# Patient Record
Sex: Female | Born: 1970 | Race: White | Hispanic: No | Marital: Married | Smoking: Never smoker
Health system: Southern US, Community
[De-identification: ages and names within clinical notes are randomized; demographics above are authoritative.]

## PROBLEM LIST (undated history)

## (undated) DIAGNOSIS — J45909 Unspecified asthma, uncomplicated: Secondary | ICD-10-CM

## (undated) DIAGNOSIS — F329 Major depressive disorder, single episode, unspecified: Secondary | ICD-10-CM

## (undated) DIAGNOSIS — F32A Depression, unspecified: Secondary | ICD-10-CM

## (undated) DIAGNOSIS — B019 Varicella without complication: Secondary | ICD-10-CM

## (undated) DIAGNOSIS — T7840XA Allergy, unspecified, initial encounter: Secondary | ICD-10-CM

## (undated) DIAGNOSIS — F419 Anxiety disorder, unspecified: Secondary | ICD-10-CM

## (undated) HISTORY — DX: Depression, unspecified: F32.A

## (undated) HISTORY — PX: WISDOM TOOTH EXTRACTION: SHX21

## (undated) HISTORY — DX: Unspecified asthma, uncomplicated: J45.909

## (undated) HISTORY — DX: Major depressive disorder, single episode, unspecified: F32.9

## (undated) HISTORY — DX: Anxiety disorder, unspecified: F41.9

## (undated) HISTORY — DX: Varicella without complication: B01.9

## (undated) HISTORY — DX: Allergy, unspecified, initial encounter: T78.40XA

---
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
## 2005-09-02 ENCOUNTER — Ambulatory Visit: Payer: Self-pay | Admitting: Allergy and Immunology

## 2006-08-01 ENCOUNTER — Inpatient Hospital Stay: Payer: Self-pay | Admitting: Obstetrics and Gynecology

## 2016-07-25 ENCOUNTER — Ambulatory Visit: Payer: Self-pay | Admitting: Family Medicine

## 2016-08-20 ENCOUNTER — Ambulatory Visit (INDEPENDENT_AMBULATORY_CARE_PROVIDER_SITE_OTHER): Payer: BC Managed Care – PPO | Admitting: Family Medicine

## 2016-08-20 ENCOUNTER — Encounter: Payer: Self-pay | Admitting: Family Medicine

## 2016-08-20 ENCOUNTER — Encounter (INDEPENDENT_AMBULATORY_CARE_PROVIDER_SITE_OTHER): Payer: Self-pay

## 2016-08-20 VITALS — BP 130/84 | HR 89 | Temp 98.4°F | Resp 18 | Ht 61.5 in | Wt 149.1 lb

## 2016-08-20 DIAGNOSIS — Z111 Encounter for screening for respiratory tuberculosis: Secondary | ICD-10-CM

## 2016-08-20 DIAGNOSIS — Z0000 Encounter for general adult medical examination without abnormal findings: Secondary | ICD-10-CM | POA: Diagnosis not present

## 2016-08-20 DIAGNOSIS — Z23 Encounter for immunization: Secondary | ICD-10-CM | POA: Diagnosis not present

## 2016-08-20 DIAGNOSIS — B019 Varicella without complication: Secondary | ICD-10-CM | POA: Insufficient documentation

## 2016-08-20 MED ORDER — TRIAMCINOLONE ACETONIDE 0.1 % EX OINT: 1.0000 "application " | TOPICAL_OINTMENT | Freq: Two times a day (BID) | CUTANEOUS | 0 refills | Status: DC

## 2016-08-20 NOTE — Patient Instructions (Addendum)
Use the topical as needed.  Follow up annually  Take care  Dr. Adriana Simas  Health Maintenance, Female Adopting a healthy lifestyle and getting preventive care can go a long way to promote health and wellness. Talk with your health care provider about what schedule of regular examinations is right for you. This is a good chance for you to check in with your provider about disease prevention and staying healthy. In between checkups, there are plenty of things you can do on your own. Experts have done a lot of research about which lifestyle changes and preventive measures are most likely to keep you healthy. Ask your health care provider for more information. WEIGHT AND DIET  Eat a healthy diet  Be sure to include plenty of vegetables, fruits, low-fat dairy products, and lean protein.  Do not eat a lot of foods high in solid fats, added sugars, or salt.  Get regular exercise. This is one of the most important things you can do for your health.  Most adults should exercise for at least 150 minutes each week. The exercise should increase your heart rate and make you sweat (moderate-intensity exercise).  Most adults should also do strengthening exercises at least twice a week. This is in addition to the moderate-intensity exercise.  Maintain a healthy weight  Body mass index (BMI) is a measurement that can be used to identify possible weight problems. It estimates body fat based on height and weight. Your health care provider can help determine your BMI and help you achieve or maintain a healthy weight.  For females 63 years of age and older:   A BMI below 18.5 is considered underweight.  A BMI of 18.5 to 24.9 is normal.  A BMI of 25 to 29.9 is considered overweight.  A BMI of 30 and above is considered obese.  Watch levels of cholesterol and blood lipids  You should start having your blood tested for lipids and cholesterol at 43 years of age, then have this test every 5 years.  You  may need to have your cholesterol levels checked more often if:  Your lipid or cholesterol levels are high.  You are older than 43 years of age.  You are at high risk for heart disease.  CANCER SCREENING   Lung Cancer  Lung cancer screening is recommended for adults 36-32 years old who are at high risk for lung cancer because of a history of smoking.  A yearly low-dose CT scan of the lungs is recommended for people who:  Currently smoke.  Have quit within the past 15 years.  Have at least a 30-pack-year history of smoking. A pack year is smoking an average of one pack of cigarettes a day for 1 year.  Yearly screening should continue until it has been 15 years since you quit.  Yearly screening should stop if you develop a health problem that would prevent you from having lung cancer treatment.  Breast Cancer  Practice breast self-awareness. This means understanding how your breasts normally appear and feel.  It also means doing regular breast self-exams. Let your health care provider know about any changes, no matter how small.  If you are in your 20s or 30s, you should have a clinical breast exam (CBE) by a health care provider every 1-3 years as part of a regular health exam.  If you are 79 or older, have a CBE every year. Also consider having a breast X-ray (mammogram) every year.  If you have a family  history of breast cancer, talk to your health care provider about genetic screening.  If you are at high risk for breast cancer, talk to your health care provider about having an MRI and a mammogram every year.  Breast cancer gene (BRCA) assessment is recommended for women who have family members with BRCA-related cancers. BRCA-related cancers include:  Breast.  Ovarian.  Tubal.  Peritoneal cancers.  Results of the assessment will determine the need for genetic counseling and BRCA1 and BRCA2 testing. Cervical Cancer Your health care provider may recommend that you  be screened regularly for cancer of the pelvic organs (ovaries, uterus, and vagina). This screening involves a pelvic examination, including checking for microscopic changes to the surface of your cervix (Pap test). You may be encouraged to have this screening done every 3 years, beginning at age 61.  For women ages 47-65, health care providers may recommend pelvic exams and Pap testing every 3 years, or they may recommend the Pap and pelvic exam, combined with testing for human papilloma virus (HPV), every 5 years. Some types of HPV increase your risk of cervical cancer. Testing for HPV may also be done on women of any age with unclear Pap test results.  Other health care providers may not recommend any screening for nonpregnant women who are considered low risk for pelvic cancer and who do not have symptoms. Ask your health care provider if a screening pelvic exam is right for you.  If you have had past treatment for cervical cancer or a condition that could lead to cancer, you need Pap tests and screening for cancer for at least 20 years after your treatment. If Pap tests have been discontinued, your risk factors (such as having a new sexual partner) need to be reassessed to determine if screening should resume. Some women have medical problems that increase the chance of getting cervical cancer. In these cases, your health care provider may recommend more frequent screening and Pap tests. Colorectal Cancer  This type of cancer can be detected and often prevented.  Routine colorectal cancer screening usually begins at 43 years of age and continues through 43 years of age.  Your health care provider may recommend screening at an earlier age if you have risk factors for colon cancer.  Your health care provider may also recommend using home test kits to check for hidden blood in the stool.  A small camera at the end of a tube can be used to examine your colon directly (sigmoidoscopy or colonoscopy).  This is done to check for the earliest forms of colorectal cancer.  Routine screening usually begins at age 30.  Direct examination of the colon should be repeated every 5-10 years through 43 years of age. However, you may need to be screened more often if early forms of precancerous polyps or small growths are found. Skin Cancer  Check your skin from head to toe regularly.  Tell your health care provider about any new moles or changes in moles, especially if there is a change in a mole's shape or color.  Also tell your health care provider if you have a mole that is larger than the size of a pencil eraser.  Always use sunscreen. Apply sunscreen liberally and repeatedly throughout the day.  Protect yourself by wearing long sleeves, pants, a wide-brimmed hat, and sunglasses whenever you are outside. HEART DISEASE, DIABETES, AND HIGH BLOOD PRESSURE   High blood pressure causes heart disease and increases the risk of stroke. High blood pressure  is more likely to develop in:  People who have blood pressure in the high end of the normal range (130-139/85-89 mm Hg).  People who are overweight or obese.  People who are African American.  If you are 105-22 years of age, have your blood pressure checked every 3-5 years. If you are 70 years of age or older, have your blood pressure checked every year. You should have your blood pressure measured twice--once when you are at a hospital or clinic, and once when you are not at a hospital or clinic. Record the average of the two measurements. To check your blood pressure when you are not at a hospital or clinic, you can use:  An automated blood pressure machine at a pharmacy.  A home blood pressure monitor.  If you are between 41 years and 15 years old, ask your health care provider if you should take aspirin to prevent strokes.  Have regular diabetes screenings. This involves taking a blood sample to check your fasting blood sugar level.  If you  are at a normal weight and have a low risk for diabetes, have this test once every three years after 43 years of age.  If you are overweight and have a high risk for diabetes, consider being tested at a younger age or more often. PREVENTING INFECTION  Hepatitis B  If you have a higher risk for hepatitis B, you should be screened for this virus. You are considered at high risk for hepatitis B if:  You were born in a country where hepatitis B is common. Ask your health care provider which countries are considered high risk.  Your parents were born in a high-risk country, and you have not been immunized against hepatitis B (hepatitis B vaccine).  You have HIV or AIDS.  You use needles to inject street drugs.  You live with someone who has hepatitis B.  You have had sex with someone who has hepatitis B.  You get hemodialysis treatment.  You take certain medicines for conditions, including cancer, organ transplantation, and autoimmune conditions. Hepatitis C  Blood testing is recommended for:  Everyone born from [birth year] through 1963.  Anyone with known risk factors for hepatitis C. Sexually transmitted infections (STIs)  You should be screened for sexually transmitted infections (STIs) including gonorrhea and chlamydia if:  You are sexually active and are younger than 43 years of age.  You are older than 43 years of age and your health care provider tells you that you are at risk for this type of infection.  Your sexual activity has changed since you were last screened and you are at an increased risk for chlamydia or gonorrhea. Ask your health care provider if you are at risk.  If you do not have HIV, but are at risk, it may be recommended that you take a prescription medicine daily to prevent HIV infection. This is called pre-exposure prophylaxis (PrEP). You are considered at risk if:  You are sexually active and do not regularly use condoms or know the HIV status of your  partner(s).  You take drugs by injection.  You are sexually active with a partner who has HIV. Talk with your health care provider about whether you are at high risk of being infected with HIV. If you choose to begin PrEP, you should first be tested for HIV. You should then be tested every 3 months for as long as you are taking PrEP.  PREGNANCY   If you are premenopausal and  you may become pregnant, ask your health care provider about preconception counseling.  If you may become pregnant, take 400 to 800 micrograms (mcg) of folic acid every day.  If you want to prevent pregnancy, talk to your health care provider about birth control (contraception). OSTEOPOROSIS AND MENOPAUSE   Osteoporosis is a disease in which the bones lose minerals and strength with aging. This can result in serious bone fractures. Your risk for osteoporosis can be identified using a bone density scan.  If you are 53 years of age or older, or if you are at risk for osteoporosis and fractures, ask your health care provider if you should be screened.  Ask your health care provider whether you should take a calcium or vitamin D supplement to lower your risk for osteoporosis.  Menopause may have certain physical symptoms and risks.  Hormone replacement therapy may reduce some of these symptoms and risks. Talk to your health care provider about whether hormone replacement therapy is right for you.  HOME CARE INSTRUCTIONS   Schedule regular health, dental, and eye exams.  Stay current with your immunizations.   Do not use any tobacco products including cigarettes, chewing tobacco, or electronic cigarettes.  If you are pregnant, do not drink alcohol.  If you are breastfeeding, limit how much and how often you drink alcohol.  Limit alcohol intake to no more than 1 drink per day for nonpregnant women. One drink equals 12 ounces of beer, 5 ounces of wine, or 1 ounces of hard liquor.  Do not use street drugs.  Do  not share needles.  Ask your health care provider for help if you need support or information about quitting drugs.  Tell your health care provider if you often feel depressed.  Tell your health care provider if you have ever been abused or do not feel safe at home.   This information is not intended to replace advice given to you by your health care provider. Make sure you discuss any questions you have with your health care provider.   Document Released: 04/22/2011 Document Revised: 10/28/2014 Document Reviewed: 09/08/2013 Elsevier Interactive Patient Education [employer].

## 2016-08-20 NOTE — Assessment & Plan Note (Signed)
Pap smear and mammogram up to date. Flu vaccine today.  TB skin test for work. PRN Triamcinolone for eczema. Screening labs today.

## 2016-08-20 NOTE — Progress Notes (Signed)
Subjective:  Patient ID: Sarah Mahoney, female    DOB: 1970-11-30  Age: 45 y.o. MRN: [id number]  CC: Annual exam, Eczema  HPI Glorimar Stroope is a 45 y.o. female presents to the clinic today to establish care and for an annual exam.  Preventative Healthcare  Pap smear: Up to date. 12/16.  Mammogram: Up to date. 7/17  Immunizations  Tetanus - Up to date.   Flu - In need of tody.  Labs: Screening labs today.   Exercise: No regular exercise.   Alcohol use: Yes.   Smoking/tobacco use: No.   PMH, Surgical Hx, Family Hx, Social History reviewed and updated as below.  Past Medical History:  Diagnosis Date  . Allergy   . Anxiety and depression   . Asthma   . Chicken pox    Past Surgical History:  Procedure Laterality Date  . TONSILLECTOMY     Family History  Problem Relation Age of Onset  . Arthritis Mother   . Arthritis Father   . Diabetes Father   . Heart disease Maternal Aunt   . Sudden Cardiac Death Maternal Aunt   . Arthritis Maternal Grandmother   . Hypertension Maternal Grandmother   . Anxiety disorder Maternal Grandmother   . Arthritis Maternal Grandfather   . Arthritis Paternal Grandmother   . Depression Paternal Grandmother   . Arthritis Paternal Grandfather   . Heart disease Paternal Grandfather   . Sudden Cardiac Death Paternal Grandfather    Social History  Substance Use Topics  . Smoking status: Never Smoker  . Smokeless tobacco: Never Used  . Alcohol use Yes    Review of Systems  Respiratory: Positive for cough.   Genitourinary:       Urinary incontinence.   Skin:       Eczema.   Psychiatric/Behavioral:       Sadness, anxiety, stress.  All other systems reviewed and are negative.   Objective:   Today's Vitals: BP 130/84 (BP Location: Right Arm, Patient Position: Sitting, Cuff Size: Normal)   Pulse 89   Temp 98.4 F (36.9 C) (Oral)   Resp 18   Ht 5' 1.5" (1.562 m)   Wt 149 lb 2 oz (67.6 kg)   SpO2 98%   BMI 27.72 kg/m    Physical Exam  Constitutional: She is oriented to person, place, and time. She appears well-developed and well-nourished. No distress.  HENT:  Head: Normocephalic and atraumatic.  Nose: Nose normal.  Mouth/Throat: Oropharynx is clear and moist. No oropharyngeal exudate.  Normal TM's bilaterally.   Eyes: Conjunctivae are normal. No scleral icterus.  Neck: Neck supple. No thyromegaly present.  Cardiovascular: Normal rate and regular rhythm.   No murmur heard. Pulmonary/Chest: Effort normal and breath sounds normal. She has no wheezes. She has no rales.  Abdominal: Soft. She exhibits no distension. There is no tenderness. There is no rebound and no guarding.  Musculoskeletal: Normal range of motion. She exhibits no edema.  Lymphadenopathy:    She has no cervical adenopathy.  Neurological: She is alert and oriented to person, place, and time.  Skin:  Scattered patches of eczema.    Psychiatric: She has a normal mood and affect.  Vitals reviewed.  Assessment & Plan:   Problem List Items Addressed This Visit    Annual physical exam - Primary    Pap smear and mammogram up to date. Flu vaccine today.  TB skin test for work. PRN Triamcinolone for eczema. Screening labs today.  Relevant Orders   CBC   Comp Met (CMET)   HgB A1c   Lipid Profile    Other Visit Diagnoses    Encounter for immunization       Relevant Orders   Flu Vaccine QUAD 36+ mos IM (Completed)   PPD (Completed)   Encounter for PPD test       Relevant Orders   PPD (Completed)      Outpatient Encounter Prescriptions as of 08/20/2016  Medication Sig  . triamcinolone ointment (KENALOG) 0.1 % Apply 1 application topically 2 (two) times daily.   No facility-administered encounter medications on file as of 08/20/2016.     Follow-up: PRN  [place]

## 2016-08-21 LAB — COMPREHENSIVE METABOLIC PANEL
ALBUMIN: 4.1 g/dL (ref 3.5–5.2)
ALT: 15 U/L (ref 0–35)
AST: 17 U/L (ref 0–37)
Alkaline Phosphatase: 41 U/L (ref 39–117)
BILIRUBIN TOTAL: 0.3 mg/dL (ref 0.2–1.2)
BUN: 17 mg/dL (ref 6–23)
CALCIUM: 9.2 mg/dL (ref 8.4–10.5)
CO2: 28 mEq/L (ref 19–32)
CREATININE: 0.91 mg/dL (ref 0.40–1.20)
Chloride: 105 mEq/L (ref 96–112)
GFR: 71.62 mL/min (ref >60.00)
Glucose, Bld: 80 mg/dL (ref 70–99)
Potassium: 4 mEq/L (ref 3.5–5.1)
Sodium: 139 mEq/L (ref 135–145)
Total Protein: 6.3 g/dL (ref 6.0–8.3)

## 2016-08-21 LAB — CBC
HCT: 41.1 % (ref 36.0–46.0)
Hemoglobin: 13.7 g/dL (ref 12.0–15.0)
MCHC: 33.3 g/dL (ref 30.0–36.0)
MCV: 91.4 fl (ref 78.0–100.0)
PLATELETS: 279 10*3/uL (ref 150.0–400.0)
RBC: 4.5 Mil/uL (ref 3.87–5.11)
RDW: 13.1 % (ref 11.5–15.5)
WBC: 8.2 10*3/uL (ref 4.0–10.5)

## 2016-08-21 LAB — LIPID PANEL
CHOLESTEROL: 186 mg/dL (ref 0–200)
HDL: 54.1 mg/dL (ref >39.00)
LDL CALC: 99 mg/dL (ref 0–99)
NONHDL: 131.42
Total CHOL/HDL Ratio: 3
Triglycerides: 162 mg/dL — ABNORMAL HIGH (ref 0.0–149.0)
VLDL: 32.4 mg/dL (ref 0.0–40.0)

## 2016-08-21 LAB — HEMOGLOBIN A1C: HEMOGLOBIN A1C: 5.5 % (ref 4.6–6.5)

## 2016-08-22 ENCOUNTER — Ambulatory Visit: Payer: BC Managed Care – PPO

## 2016-08-22 ENCOUNTER — Ambulatory Visit (INDEPENDENT_AMBULATORY_CARE_PROVIDER_SITE_OTHER): Payer: BC Managed Care – PPO

## 2016-08-22 DIAGNOSIS — Z111 Encounter for screening for respiratory tuberculosis: Secondary | ICD-10-CM

## 2016-08-22 LAB — TB SKIN TEST
INDURATION: 0 mm
TB SKIN TEST: NEGATIVE

## 2016-08-22 NOTE — Progress Notes (Signed)
Patient came in for PPD reading .  She stated she left some forms with you when she was in for office visit on 08/20/2016.  She would like to come by to pick those forms up

## 2016-09-25 ENCOUNTER — Telehealth: Payer: Self-pay | Admitting: *Deleted

## 2016-09-25 NOTE — Telephone Encounter (Signed)
Pt requested a copy of her PPD reading Pt contact [phone]

## 2016-09-26 NOTE — Telephone Encounter (Signed)
Left voicemail advising copy of immunization record printed

## 2017-01-08 NOTE — Telephone Encounter (Signed)
Pt did not pick up immunization I have sent to shred.

## 2017-06-11 ENCOUNTER — Ambulatory Visit (INDEPENDENT_AMBULATORY_CARE_PROVIDER_SITE_OTHER): Payer: BLUE CROSS/BLUE SHIELD | Admitting: Certified Nurse Midwife

## 2017-06-11 ENCOUNTER — Encounter: Payer: Self-pay | Admitting: Certified Nurse Midwife

## 2017-06-11 VITALS — BP 104/64 | HR 70 | Ht 62.0 in | Wt 157.0 lb

## 2017-06-11 DIAGNOSIS — F32A Depression, unspecified: Secondary | ICD-10-CM | POA: Insufficient documentation

## 2017-06-11 DIAGNOSIS — Z1231 Encounter for screening mammogram for malignant neoplasm of breast: Secondary | ICD-10-CM | POA: Diagnosis not present

## 2017-06-11 DIAGNOSIS — F419 Anxiety disorder, unspecified: Secondary | ICD-10-CM | POA: Insufficient documentation

## 2017-06-11 DIAGNOSIS — Z01419 Encounter for gynecological examination (general) (routine) without abnormal findings: Secondary | ICD-10-CM | POA: Diagnosis not present

## 2017-06-11 DIAGNOSIS — J45909 Unspecified asthma, uncomplicated: Secondary | ICD-10-CM | POA: Insufficient documentation

## 2017-06-11 DIAGNOSIS — Z124 Encounter for screening for malignant neoplasm of cervix: Secondary | ICD-10-CM | POA: Diagnosis not present

## 2017-06-11 DIAGNOSIS — N939 Abnormal uterine and vaginal bleeding, unspecified: Secondary | ICD-10-CM

## 2017-06-11 DIAGNOSIS — Z113 Encounter for screening for infections with a predominantly sexual mode of transmission: Secondary | ICD-10-CM

## 2017-06-11 DIAGNOSIS — F329 Major depressive disorder, single episode, unspecified: Secondary | ICD-10-CM | POA: Insufficient documentation

## 2017-06-11 DIAGNOSIS — Z1239 Encounter for other screening for malignant neoplasm of breast: Secondary | ICD-10-CM

## 2017-06-11 MED ORDER — DOXYCYCLINE HYCLATE 100 MG PO CAPS: 100.0000 mg | ORAL_CAPSULE | Freq: Two times a day (BID) | ORAL | 0 refills | Status: AC

## 2017-06-11 NOTE — Progress Notes (Signed)
Gynecology Annual Exam  PCP: Tommie Sams, DO  Chief Complaint:  Chief Complaint  Patient presents with  . Gynecologic Exam    History of Present Illness:Sarah Mahoney presents today for her annual exam. She is a 46 year old Caucasian/White female, G1 P1001, whose LMP was 05/20/2017. Her menses are irregular with her Mirena IUD. They occur every 3-5 weeks and last 7-14 days, but are very light. She states that her last few menses have lasted 14 days.  She has occasional dysmenorrhea, but does not need analgesia . She denies any new sexual partners. She had her IUD replaced 08/15/2016  The patient's past medical history is notable for a history of Allergic rhinitis, Anxiety, and Major Depression (Recurrent). Since her last annual GYN exam dated 10/04/2015, she has had no significant changes in her health history. Has noticed alternating diarrhea and constipation. 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 She is sexually active. She is currently using an IUD for contraception. The Mirena IUD was placed on 08/15/2016.  Her most recent pap smear was obtained 10/04/2015 and was NIL/negative HRHPV Her most recent mammogram obtained on 06/15/2015 was normal. There is no family history of breast cancer. There is no family history of ovarian cancer. The patient does do monthly self breast exams.  The patient does not smoke.  The patient does drink rarely.  The patient does not use illegal drugs. She does exercise regularly.  The patient does get adequate calcium in her diet.  She had a recent cholesterol screen 08/2016 that was normal.      Review of Systems: Review of Systems  Constitutional: Negative for chills, fever and weight loss.       Positive for weight gain  HENT: Negative for congestion, sinus pain and sore throat.   Eyes: Negative for blurred vision and pain.    Respiratory: Negative for hemoptysis, shortness of breath and wheezing.   Cardiovascular: Negative for chest pain, palpitations and leg swelling.  Gastrointestinal: Positive for constipation and diarrhea. Negative for abdominal pain, blood in stool, heartburn, nausea and vomiting.       Alternating diarrhea and constipation  Genitourinary: Negative for dysuria, frequency, hematuria and urgency.  Musculoskeletal: Negative for back pain, joint pain and myalgias.  Skin: Negative for itching and rash.  Neurological: Negative for dizziness, tingling and headaches.  Endo/Heme/Allergies: Negative for environmental allergies and polydipsia. Does not bruise/bleed easily.       Negative for hirsutism   Psychiatric/Behavioral: Negative for depression. The patient is nervous/anxious. The patient does not have insomnia.     Past Medical History:  Past Medical History:  Diagnosis Date  . Allergy   . Anxiety   . Asthma   . Chicken pox   . Depression     Past Surgical History:  Past Surgical History:  Procedure Laterality Date  . ADENOIDECTOMY  1987  . TONSILLECTOMY  1987  . WISDOM TOOTH EXTRACTION      Family History:  Family History  Problem Relation Age of Onset  . Arthritis Mother   . Arthritis Father   . Diabetes Father   . Heart disease Maternal Aunt   . Sudden Cardiac Death Maternal Aunt   . Arthritis Maternal Grandmother   . Hypertension Maternal Grandmother   . Anxiety disorder Maternal Grandmother   . Arthritis Maternal Grandfather   . Arthritis Paternal Grandmother   .  Depression Paternal Grandmother   . Arthritis Paternal Grandfather   . Heart disease Paternal Grandfather   . Sudden Cardiac Death Paternal Grandfather     Social History:  Social History   Social History  . Marital status: Legally Separated    Spouse name: N/A  . Number of children: 1  . Years of education: N/A   Occupational History  . Not on file.   Social History Main Topics  . Smoking  status: Never Smoker  . Smokeless tobacco: Never Used  . Alcohol use Yes     Comment: rare  . Drug use: No  . Sexual activity: Yes    Partners: Male    Birth control/ protection: IUD   Other Topics Concern  . Not on file   Social History Narrative  . No narrative on file    Allergies:  No Known Allergies  Medications: Current Outpatient Prescriptions:  .  ALPRAZolam (XANAX) 0.5 MG tablet, Take by mouth., Disp: , Rfl:  .  busPIRone (BUSPAR) 15 MG tablet, Take by mouth., Disp: , Rfl:  .  fluticasone (FLONASE) 50 MCG/ACT nasal spray, , Disp: , Rfl: 4 .  guaiFENesin (MUCINEX) 600 MG 12 hr tablet, Take by mouth., Disp: , Rfl:  .  levonorgestrel (MIRENA) 20 MCG/24HR IUD, 1 each by Intrauterine route once., Disp: , Rfl:  .  mirtazapine (REMERON) 30 MG tablet, Take by mouth., Disp: , Rfl:  .  triamcinolone ointment (KENALOG) 0.1 %, Apply 1 application topically 2 (two) times daily., Disp: 30 g, Rfl: 0 .  doxycycline (VIBRAMYCIN) 100 MG capsule, Take 1 capsule (100 mg total) by mouth 2 (two) times daily., Disp: 20 capsule, Rfl: 0 Physical Exam Vitals:BP 104/64   Pulse 70   Ht 5\' 2"  (1.575 m)   Wt 157 lb (71.2 kg)   LMP 05/20/2017 (Exact Date)   BMI 28.72 kg/m   General: WF in  NAD HEENT: normocephalic, anicteric Neck: no thyroid enlargement, no palpable nodules, no cervical lymphadenopathy  Pulmonary: No increased work of breathing, CTAB Cardiovascular: RRR, without murmur  Breast: Breast symmetrical, no tenderness, no palpable nodules or masses, no skin or nipple retraction present, no nipple discharge.  No axillary, infraclavicular or supraclavicular lymphadenopathy. Abdomen: Soft, non-tender, non-distended.  Umbilicus without lesions.  No hepatomegaly or masses palpable. No evidence of hernia. Genitourinary:  External: Normal external female genitalia.  Normal urethral meatus, normal Bartholin's and Skene's glands.    Vagina: Normal vaginal mucosa, no evidence of prolapse.      Cervix: Grossly normal in appearance, no bleeding, non-tender, IUD strings visible  Uterus: Anteverted, normal size, shape, and consistency, mobile, and non-tender  Adnexa: No adnexal masses, non-tender  Rectal: deferred  Lymphatic: no evidence of inguinal lymphadenopathy Extremities: no edema, erythema, or tenderness Neurologic: Grossly intact Psychiatric: mood appropriate, affect full     Assessment: 46 y.o. G1P1001 annual gyn exam Prolonged bleeding on the Mirena IUD  Plan:   1) Breast cancer screening - recommend monthly self breast exam and annual mammograms.. Mammogram was ordered today. Patient to schedule at [facility]  2) Options for treatment of bothersome bleeding with the Mirena: 1) cycling on BCPs, 2) treatment with NSAIDs, 3) removal of IUD and further evaluation if bleeding problems continue, 4)course of doxycycline. Patient desires to try a course of doxycycline.Discussed possible side effects of antibiotics. Instructed to avoid prolonged sun exposure and to take meds with food. To RTO if bothersome bleeding persists  3) Cervical cancer screening - Pap was done  4) Routine healthcare maintenance including cholesterol and diabetes screening managed by PCP   Farrel Conners, CNM

## 2017-06-13 LAB — PAP IG, CT-NG, RFX HPV ALL
CHLAMYDIA, NUC. ACID AMP: NEGATIVE
GONOCOCCUS BY NUCLEIC ACID AMP: NEGATIVE
PAP SMEAR COMMENT: 0

## 2017-07-21 ENCOUNTER — Telehealth: Payer: Self-pay

## 2017-07-21 NOTE — Telephone Encounter (Signed)
Pt calling to ck on test results.  [phone].  Pt aware of neg pap results.

## 2017-10-07 ENCOUNTER — Ambulatory Visit
Admission: RE | Admit: 2017-10-07 | Discharge: 2017-10-07 | Disposition: A | Discharge status: Home / Self Care | Payer: BLUE CROSS/BLUE SHIELD | Source: Ambulatory Visit | Attending: Certified Nurse Midwife | Admitting: Certified Nurse Midwife

## 2017-10-07 DIAGNOSIS — Z1239 Encounter for other screening for malignant neoplasm of breast: Secondary | ICD-10-CM

## 2017-10-07 DIAGNOSIS — Z1231 Encounter for screening mammogram for malignant neoplasm of breast: Secondary | ICD-10-CM | POA: Insufficient documentation

## 2017-10-10 ENCOUNTER — Inpatient Hospital Stay
Admission: RE | Admit: 2017-10-10 | Discharge: 2017-10-10 | Disposition: A | Discharge status: Home / Self Care | Payer: Self-pay | Source: Ambulatory Visit | Attending: *Deleted | Admitting: *Deleted

## 2017-10-10 ENCOUNTER — Other Ambulatory Visit: Payer: Self-pay | Admitting: *Deleted

## 2017-10-10 DIAGNOSIS — Z9289 Personal history of other medical treatment: Secondary | ICD-10-CM

## 2017-10-27 ENCOUNTER — Encounter: Payer: Self-pay | Admitting: Certified Nurse Midwife

## 2017-10-27 ENCOUNTER — Ambulatory Visit: Payer: BLUE CROSS/BLUE SHIELD | Admitting: Certified Nurse Midwife

## 2017-10-27 VITALS — BP 120/80 | HR 85 | Ht 62.0 in | Wt 163.0 lb

## 2017-10-27 DIAGNOSIS — N921 Excessive and frequent menstruation with irregular cycle: Secondary | ICD-10-CM | POA: Diagnosis not present

## 2017-10-27 NOTE — Progress Notes (Signed)
Obstetrics & Gynecology Office Visit   Chief Complaint:  Chief Complaint  Patient presents with  . Menstrual Problem    History of Present Illness: Sarah Mahoney is a 47 year old Caucasian/White female, G1 P1001, whose LMP was 09/1717. Her menses are irregular with her Mirena IUD. They occur every 3-5 weeks and last 7-14 days, but are usually very light. When she was seen for her annual in August she complained of her menses lasting 14 days.  She opted to try a course of doxycycline to help stabilize her endometrium and her next two or three menses did shorten a little. In November, she was at work and her menses started with a large gush saturating her clothes and her menses went on to last 2 weeks. Her bleeding with that menses was heavier than usual (moderate flow). There was a week break from bleeding then it restarted and lasted another week. She has occasional dysmenorrhea, but does not need analgesia . She denies any new sexual partners. She had her IUD replaced 08/15/2016.   Review of Systems:  ROS -see HPI  Past Medical History:  Past Medical History:  Diagnosis Date  . Allergy   . Anxiety   . Asthma   . Chicken pox   . Depression     Past Surgical History:  Past Surgical History:  Procedure Laterality Date  . ADENOIDECTOMY  1987  . TONSILLECTOMY  1987  . WISDOM TOOTH EXTRACTION      Gynecologic History: Patient's last menstrual period was 10/06/2017.  Obstetric History: G1P1001  Family History:  Family History  Problem Relation Age of Onset  . Arthritis Mother   . Arthritis Father   . Diabetes Father   . Heart disease Maternal Aunt   . Sudden Cardiac Death Maternal Aunt   . Arthritis Maternal Grandmother   . Hypertension Maternal Grandmother   . Anxiety disorder Maternal Grandmother   . Arthritis Maternal Grandfather   . Arthritis Paternal Grandmother   . Depression Paternal Grandmother   . Arthritis Paternal Grandfather   . Heart disease Paternal  Grandfather   . Sudden Cardiac Death Paternal Grandfather     Social History:  Social History   Socioeconomic History  . Marital status: Legally Separated    Spouse name: Not on file  . Number of children: 1  . Years of education: Not on file  . Highest education level: Not on file  Social Needs  . Financial resource strain: Not on file  . Food insecurity - worry: Not on file  . Food insecurity - inability: Not on file  . Transportation needs - medical: Not on file  . Transportation needs - non-medical: Not on file  Occupational History  . Not on file  Tobacco Use  . Smoking status: Never Smoker  . Smokeless tobacco: Never Used  Substance and Sexual Activity  . Alcohol use: Yes    Comment: rare  . Drug use: No  . Sexual activity: Yes    Partners: Male    Birth control/protection: IUD  Other Topics Concern  . Not on file  Social History Narrative  . Not on file    Allergies:  No Known Allergies  Medications: Prior to Admission medications   Medication Sig Start Date End Date Taking? Authorizing Provider  ALPRAZolam Prudy Feeler(XANAX) 0.5 MG tablet Take by mouth.    [provider]  busPIRone (BUSPAR) 15 MG tablet Take by mouth.    [provider]  fluticasone Aleda Grana(FLONASE)  50 MCG/ACT nasal spray  06/02/17   [provider]  guaiFENesin (MUCINEX) 600 MG 12 hr tablet Take by mouth.    [provider]  levonorgestrel (MIRENA) 20 MCG/24HR IUD 1 each by Intrauterine route once.    [provider]  mirtazapine (REMERON) 30 MG tablet Take by mouth.    [provider]  triamcinolone ointment (KENALOG) 0.1 % Apply 1 application topically 2 (two) times daily. 08/20/16   Tommie Sams, DO    Physical Exam Vitals:BP 120/80   Pulse 85   Ht 5\' 2"  (1.575 m)   Wt 163 lb (73.9 kg)   LMP 10/06/2017   BMI 29.81 kg/m  Patient's last menstrual period was 10/06/2017.  Physical Exam  Constitutional: She is oriented to person, place, and  time. She appears well-developed and well-nourished. No distress.  GI: Soft. She exhibits no distension. There is no tenderness.  Genitourinary:  Genitourinary Comments: Vulva: no lesions or blood Vagina: scant bloody discharge Cervix: IUD strings at the cervical os, two small [place] cysts at 1 and 5 o'clock near os, NT Uterus: RF, NSSC, mobile, NT  Musculoskeletal: Normal range of motion.  Neurological: She is alert and oriented to person, place, and time.  Skin: Skin is warm and dry.  Psychiatric: She has a normal mood and affect.     Assessment: 47 y.o. G1P1001 metrorrhagia/ prolonged bleeding on IUD  Plan: Discussed options with patient: 1) Remove IUD and see if bleeding normalizes. Use condoms for [facility].2) Remove IUD and do a SIS to look for polyps/fibroids 3) Keep IUD and start OCPS to try to control bleeding. 4) Remove IUD and start OCPS. Patient desires plan#2. Scheduled to have IUD removed and SIS with Dr Jean Rosenthal on 21 January (pt preference) Declined removal of IUD today.  Farrel Conners, CNM

## 2017-11-10 ENCOUNTER — Other Ambulatory Visit: Payer: Self-pay | Admitting: Obstetrics and Gynecology

## 2017-11-10 ENCOUNTER — Encounter: Payer: Self-pay | Admitting: Certified Nurse Midwife

## 2017-11-10 ENCOUNTER — Ambulatory Visit (INDEPENDENT_AMBULATORY_CARE_PROVIDER_SITE_OTHER): Payer: BLUE CROSS/BLUE SHIELD | Admitting: Obstetrics and Gynecology

## 2017-11-10 ENCOUNTER — Encounter: Payer: Self-pay | Admitting: Obstetrics and Gynecology

## 2017-11-10 ENCOUNTER — Ambulatory Visit (INDEPENDENT_AMBULATORY_CARE_PROVIDER_SITE_OTHER): Payer: BLUE CROSS/BLUE SHIELD | Admitting: Certified Nurse Midwife

## 2017-11-10 ENCOUNTER — Ambulatory Visit (INDEPENDENT_AMBULATORY_CARE_PROVIDER_SITE_OTHER): Payer: BLUE CROSS/BLUE SHIELD

## 2017-11-10 VITALS — BP 130/80 | HR 83 | Ht 62.0 in | Wt 161.0 lb

## 2017-11-10 DIAGNOSIS — N921 Excessive and frequent menstruation with irregular cycle: Secondary | ICD-10-CM

## 2017-11-10 DIAGNOSIS — Z30432 Encounter for removal of intrauterine contraceptive device: Secondary | ICD-10-CM

## 2017-11-10 DIAGNOSIS — N939 Abnormal uterine and vaginal bleeding, unspecified: Secondary | ICD-10-CM

## 2017-11-10 MED ORDER — NORETHIN-ETH ESTRAD-FE BIPHAS 1 MG-10 MCG / 10 MCG PO TABS: 1.0000 | ORAL_TABLET | Freq: Every day | ORAL | 3 refills | Status: DC

## 2017-11-10 NOTE — Progress Notes (Signed)
Gynecology Ultrasound Follow Up  Chief Complaint: menorrhagia with irregular cycle.   History of Present Illness: Patient is a 47 y.o. female who presents today for ultrasound evaluation of the above .  Ultrasound demonstrates the following findings Adnexa: no masses seen  Uterus: anteverted with endometrial stripe  3.3 mm Additional: no focal defects noted in uterus.  The patient has a history two prior IUDs without abnormal symptoms. With this IUD she has had prolonged menses, lasting two weeks at a time, often irregular. She had one episode of very heavy bleeding. The IUD was removed prior to the sonohysterogram today. So, I could not verify correct placement (or incorrect) as cause of abnormal bleeding.  Past Medical History:  Diagnosis Date  . Allergy   . Anxiety   . Asthma   . Chicken pox   . Depression     Past Surgical History:  Procedure Laterality Date  . ADENOIDECTOMY  1987  . TONSILLECTOMY  1987  . WISDOM TOOTH EXTRACTION      Family History  Problem Relation Age of Onset  . Arthritis Mother   . Arthritis Father   . Diabetes Father   . Heart disease Maternal Aunt   . Sudden Cardiac Death Maternal Aunt   . Arthritis Maternal Grandmother   . Hypertension Maternal Grandmother   . Anxiety disorder Maternal Grandmother   . Arthritis Maternal Grandfather   . Arthritis Paternal Grandmother   . Depression Paternal Grandmother   . Arthritis Paternal Grandfather   . Heart disease Paternal Grandfather   . Sudden Cardiac Death Paternal Grandfather     Social History   Socioeconomic History  . Marital status: Married    Spouse name: Not on file  . Number of children: 1  . Years of education: Not on file  . Highest education level: Not on file  Social Needs  . Financial resource strain: Not on file  . Food insecurity - worry: Not on file  . Food insecurity - inability: Not on file  . Transportation needs - medical: Not on file  . Transportation needs -  non-medical: Not on file  Occupational History  . Not on file  Tobacco Use  . Smoking status: Never Smoker  . Smokeless tobacco: Never Used  Substance and Sexual Activity  . Alcohol use: Yes    Comment: rare  . Drug use: No  . Sexual activity: Yes    Partners: Male    Birth control/protection: IUD  Other Topics Concern  . Not on file  Social History Narrative  . Not on file    No Known Allergies  Prior to Admission medications   Medication Sig Start Date End Date Taking? Authorizing Provider  ALPRAZolam (XANAX) 0.25 MG tablet TAKE 1 TABLET BY MOUTH TWICE A DAY AT 8AM AND 5PM AS NEEDED 10/22/17   [provider]  busPIRone (BUSPAR) 15 MG tablet Take by mouth.    [provider]  FLUCELVAX QUADRIVALENT 0.5 ML SUSY  07/29/17   [provider]  fluticasone Aleda Grana(FLONASE) 50 MCG/ACT nasal spray  06/02/17   [provider]  levonorgestrel (MIRENA) 20 MCG/24HR IUD 1 each by Intrauterine route once.    [provider]  mirtazapine (REMERON) 30 MG tablet Take by mouth.    [provider]  triamcinolone ointment (KENALOG) 0.1 % Apply 1 application topically 2 (two) times daily. 08/20/16   Tommie Samsook, Jayce G, DO    Physical Exam There were no vitals taken for this visit.  General: NAD HEENT: normocephalic, anicteric Pulmonary: No increased work of breathing Extremities: no edema, erythema, or tenderness Neurologic: Grossly intact, normal gait Psychiatric: mood appropriate, affect full   Assessment: 47 y.o. G1P1001 with  1. Menorrhagia with irregular cycle      Plan: Problem List Items Addressed This Visit    None    Visit Diagnoses    Menorrhagia with irregular cycle    -  Primary   Relevant Medications   Norethindrone-Ethinyl Estradiol-Fe Biphas (LO LOESTRIN FE) 1 MG-10 MCG / 10 MCG tablet     Discussed management options for abnormal uterine bleeding including expectant, NSAIDs, tranexamic acid (Lysteda), oral progesterone  (Provera, norethindrone, megace), Depo Provera, Levonorgestrel containing IUD, endometrial ablation (Novasure) or hysterectomy as definitive surgical management.  Discussed risks and benefits of each method.   Patient opts to keep IUD out for now. She would like to try combined OCPs with a low dose of estrogen.  I think this is reasonable. However, if a thin endometrial stripe (3.3 mm today) is the cause, this may not fix the issue.  She may need a higher dose of estrogen (35 mcg ethinyl estradiol) for a few months to regulate bleeding. If bleeding issue not fixed, she may need endometrial sampling (low risk due to 11 years of progesterone-secreting IUD in place). Consider adenomyosis, but not highly suggestive on ultrasound today.  Patient provided with 4 packs of lo loestrin today.  20 minutes spent in face to face discussion with > 50% spent in counseling, management, and coordination of care of her menorrhagia with irregular cycle.   Thomasene Mohair, MD 11/10/2017 5:49 PM

## 2017-11-10 NOTE — Progress Notes (Signed)
    Procedure Saline Infusion Sonohysterogram:  After obtaining informed consent, the patient was placed in the dorsal supine lithotomy position.  A bivalved speculum was placed in her vagina with visualization of the cervix.  The cervix was prepped x 2 with betadine.  A single tooth tenaculum was not utilized to place the catheter.  A small sonohysterogram catheter was threaded through the cervix.  The speculum was removed from the vagina.  The transvaginal ultrasound probe was placed in the vagina.  A total of 20 mL of sterile saline was injected in the uterus with capture and image reconstruction using 3-D ultrasound.  There was leakage of fluid from the cervix with the first two instillations.  The ultrasound wand was removed and the bivalved speculum was replaced.  The catheter was removed and an HSG catheter with a balloon tip was placed.  The balloon was inflated with 1 ml sterile saline to assist with visualization. Another 5 mL sterile saline was infused.  The patient tolerated the procedure well.    There were no focal defects or lesions noted.  See the ultrasound report for details of results.  Female chaperone present for pelvic exam:   Thomasene MohairStephen Britney Newstrom, MD 11/10/2017 5:42 PM

## 2017-11-10 NOTE — Progress Notes (Signed)
  History of Present Illness:  Sarah QuestMary Beth Froberg is a 47 y.o. that had a Mirena IUD replaced approximately 15 months ago. She has been having prolonged menses and in the past 1-2 months, has been bleeding most days. She is scheduled for an IUD removal and further evaluation of her bleeding with a SIS today by Dr Jean RosenthalJackson. ?LMP 10/29/2017   Patient Active Problem List   Diagnosis Date Noted  . Depression   . Asthma   . Anxiety   . Annual physical exam 08/20/2016   Medications:  Current Outpatient Medications on File Prior to Visit  Medication Sig Dispense Refill  . ALPRAZolam (XANAX) 0.25 MG tablet TAKE 1 TABLET BY MOUTH TWICE A DAY AT 8AM AND 5PM AS NEEDED  1  . busPIRone (BUSPAR) 15 MG tablet Take by mouth.    Marland Kitchen. FLUCELVAX QUADRIVALENT 0.5 ML SUSY   0  . fluticasone (FLONASE) 50 MCG/ACT nasal spray   4  . levonorgestrel (MIRENA) 20 MCG/24HR IUD 1 each by Intrauterine route once.    . mirtazapine (REMERON) 30 MG tablet Take by mouth.    . triamcinolone ointment (KENALOG) 0.1 % Apply 1 application topically 2 (two) times daily. 30 g 0   No current facility-administered medications on file prior to visit.    Allergies: has No Known Allergies.  Physical Exam:  BP 130/80   Pulse 83   Ht 5\' 2"  (1.575 m)   Wt 161 lb (73 kg)   BMI 29.45 kg/m  Body mass index is 29.45 kg/m. Constitutional: Well nourished, well developed female in no acute distress.  Abdomen: diffusely non tender to palpation, non distended, and no masses, hernias Neuro: Grossly intact Psych:  Normal mood and affect.    Pelvic exam:  Two IUD strings present seen coming from the cervical os. EGBUS, vaginal vault and cervix: within normal limits  IUD Removal Strings of IUD identified and grasped.  IUD removed without problem.  Pt tolerated this well.  IUD noted to be intact.  Assessment: IUD Removal AUB Plan: IUD removed  SIS this afternoon by Dr Jean RosenthalJackson.  Farrel ConnersColleen Ryli Standlee, CNM 11/10/2017 3:42 PM

## 2018-01-21 ENCOUNTER — Ambulatory Visit: Payer: Self-pay | Admitting: Family Medicine

## 2018-01-21 ENCOUNTER — Encounter: Payer: Self-pay | Admitting: Family Medicine

## 2018-01-21 VITALS — BP 122/86 | HR 94 | Temp 98.5°F | Resp 16

## 2018-01-21 DIAGNOSIS — R05 Cough: Secondary | ICD-10-CM

## 2018-01-21 DIAGNOSIS — J4521 Mild intermittent asthma with (acute) exacerbation: Secondary | ICD-10-CM

## 2018-01-21 DIAGNOSIS — R059 Cough, unspecified: Secondary | ICD-10-CM

## 2018-01-21 MED ORDER — ALBUTEROL SULFATE HFA 108 (90 BASE) MCG/ACT IN AERS: 2.0000 | INHALATION_SPRAY | Freq: Four times a day (QID) | RESPIRATORY_TRACT | 0 refills | Status: DC | PRN

## 2018-01-21 NOTE — Progress Notes (Addendum)
Subjective: cough      Sarah Mahoney is a 47 y.o. female who presents for evaluation of cough and nasal congestion for 1 week.  Patient reports symptoms have been mild.  Denies purulent nasal discharge or facial pressure.  Describes cough as nonproductive and mild.  Reports intermittent mild wheezing over the last week.  Denies fever or chills.  Reports noticing a little bit of difficult time breathing and fatigue with exertion during the last week.  Symptoms have remained stable.  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  Reports she has not used her albuterol inhaler in over 2 years, including with this illness.  Denies any previous exacerbations requiring oral corticosteroids or any previous hospitalization related to her asthma.  Patient has a history of allergic rhinitis, which she takes Flonase daily for. Treatment to date: Sudafed.  Denies rash, nausea, vomiting, diarrhea, shortness of breath, chest or back pain, ear pain, sore throat, difficulty swallowing, confusion, headache, body aches, fever, chills, severe symptoms, or initial improvement and then worsening of symptoms. History of smoking, asthma, COPD: Positive only for exercise-induced asthma as described above. History of recurrent sinus and/or lung infections: Negative. Medical history: Anxiety and allergic rhinitis.  Review of Systems Pertinent items noted in HPI and remainder of comprehensive ROS otherwise negative.     Objective:  Physical Exam General: Awake, alert, and oriented. No acute distress. Well developed, hydrated and nourished. Appears stated age. Nontoxic appearance.  HEENT: No PND noted. No erythema, edema or exudates of pharynx or tonsils. No erythema or bulging of TM.  Pale, boggy nasal mucosa. Sinuses nontender. Supple neck without adenopathy. Cardiac: Heart rate and rhythm are normal.  No murmurs, gallops, or rubs are auscultated. S1 and S2 are heard and are of normal intensity.  Respiratory: No signs of respiratory distress. Lungs clear. No tachypnea. Able to speak in full sentences without dyspnea. Nonlabored respirations.  Skin: Skin is warm, dry and intact. Appropriate color for ethnicity. No cyanosis noted.    Oxygen saturation 97% on room air  Diagnostic Results: None.  Assessment:    Allergic Rhinitis  Asthma   Acute viral upper respiratory infection  Plan:    Discussed diagnosis and treatment of URI. Discussed the importance of avoiding unnecessary antibiotic therapy. Suggested symptomatic OTC remedies. Nasal saline spray for congestion. Rest, fluids. Avoid exposure to tobacco smoke and fumes B-agonist inhaler refilled and patient instructed to use this regularly for the next 2 days to improve her cough and wheezing and as needed after that.  Not prescribing an oral corticosteroid at this time due to patient's normal lung exam and mild symptoms, in addition to patient has not attempted using an albuterol inhaler yet.  Discussed further treatment options if this is not effective. Follow-up with primary care provider. Discussed red flag symptoms and circumstances with which to to seek medical care.   New Prescriptions   ALBUTEROL (PROVENTIL HFA;VENTOLIN HFA) 108 (90 BASE) MCG/ACT INHALER    Inhale 2 puffs into the lungs every 6 (six) hours as needed for wheezing or shortness of breath (cough, or shortness of breath).     01/22/18: Patient reports that she is still having a difficult time sleeping due to her cough.  Reports symptoms have remained stable.  No worsening of symptoms or new symptoms.  Told her I would call in Tessalon Perles for her to take  tonight to help with sleep and see her in the clinic in the morning to reevaluate her.  Advised patient regarding side/adverse effects of this and that it could potentially worsen bronchospasm.

## 2018-01-22 MED ORDER — BENZONATATE 200 MG PO CAPS
200.0000 mg | ORAL_CAPSULE | Freq: Every evening | ORAL | 0 refills | Status: DC | PRN
Start: 2018-01-22 — End: 2018-11-27

## 2018-01-22 NOTE — Addendum Note (Signed)
Addended by: Frances Maywood'JERNES, Chelcea Zahn M on: 01/22/2018 04:06 PM   Modules accepted: Orders

## 2018-01-23 ENCOUNTER — Encounter: Payer: Self-pay | Admitting: Family Medicine

## 2018-01-23 ENCOUNTER — Ambulatory Visit: Payer: Self-pay | Admitting: Family Medicine

## 2018-01-23 VITALS — BP 139/76 | HR 93 | Temp 98.6°F | Resp 20

## 2018-01-23 DIAGNOSIS — J0100 Acute maxillary sinusitis, unspecified: Secondary | ICD-10-CM

## 2018-01-23 DIAGNOSIS — J45901 Unspecified asthma with (acute) exacerbation: Secondary | ICD-10-CM

## 2018-01-23 MED ORDER — PREDNISONE 20 MG PO TABS: 40.0000 mg | ORAL_TABLET | Freq: Every day | ORAL | 0 refills | Status: AC

## 2018-01-23 MED ORDER — AMOXICILLIN-POT CLAVULANATE 875-125 MG PO TABS: 1.0000 | ORAL_TABLET | Freq: Two times a day (BID) | ORAL | 0 refills | Status: AC

## 2018-01-23 NOTE — Progress Notes (Signed)
Subjective: cough     01/21/18: Sarah Mahoney is a 47 y.o. female who presents for evaluation of cough and nasal congestion for 1 week.  Patient reports symptoms have been mild.  Denies purulent nasal discharge or facial pressure.  Describes cough as nonproductive and mild.  Reports intermittent mild wheezing over the last week.  Denies fever or chills.  Reports noticing a little bit of difficult time breathing and fatigue with exertion during the last week.  Symptoms have remained stable.  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  Reports she has not used her albuterol inhaler in over 2 years, including with this illness.  Denies any previous exacerbations requiring oral corticosteroids or any previous hospitalization related to her asthma.  Patient has a history of allergic rhinitis, which she takes Flonase daily for. Treatment to date: Sudafed.  Denies rash, nausea, vomiting, diarrhea, shortness of breath, chest or back pain, ear pain, sore throat, difficulty swallowing, confusion, headache, body aches, fever, chills, severe symptoms, or initial improvement and then worsening of symptoms. History of smoking, asthma, COPD: Positive only for exercise-induced asthma as described above. History of recurrent sinus and/or lung infections: Negative. Medical history: Anxiety and allergic rhinitis.  01/23/18: Patient presents today for follow-up of cough, mild intermittent wheezing, and new onset facial pressure.  Patient has been compliant with regular albuterol use and reports a little improvement in her symptoms of wheezing and cough.  Patient took [employer] last night to try and help her sleep, which helped some, but she reports that she still had poor sleep due to PND and cough.  Patient unsure of color of nasal discharge.  Nonproductive cough.  New-onset facial pressure  yesterday and today in her mid face bilaterally.  Patient reports that since the onset of her symptoms 9 days ago she has had no improvement in symptoms.  Patient reports that she usually gets a sinus infection each year around this time following worsening of her allergic rhinitis symptoms with seasonal change.  No antibiotic use within the last 3 months.  Review of Systems Pertinent items noted in HPI and remainder of comprehensive ROS otherwise negative.     Objective:  01/21/18: Physical Exam General: Awake, alert, and oriented. No acute distress. Well developed, hydrated and nourished. Appears stated age. Nontoxic appearance.  HEENT: No PND noted. No erythema, edema or exudates of pharynx or tonsils. No erythema or bulging of TM.  Pale, boggy nasal mucosa. Sinuses nontender. Supple neck without adenopathy. Cardiac: Heart rate and rhythm are normal. No murmurs, gallops, or rubs are auscultated. S1 and S2 are heard and are of normal intensity.  Respiratory: No signs of respiratory distress. Lungs clear. No tachypnea. Able to speak in full sentences without dyspnea. Nonlabored respirations.  Skin: Skin is warm, dry and intact. Appropriate color for ethnicity. No cyanosis noted.   01/23/18:  General: Awake, alert, and oriented. No acute distress. Well developed, hydrated and nourished. Appears stated age. Nontoxic appearance.  HEENT: PND noted. No erythema, edema or exudates of pharynx or tonsils.  Pale, boggy nasal mucosa.  Maxillary sinus tenderness bilaterally.  Remainder of sinuses nontender. Supple neck without adenopathy. Cardiac: Heart rate and rhythm are normal. No murmurs, gallops, or rubs are auscultated. S1 and S2 are heard and are of normal intensity.  Respiratory: No signs of respiratory distress. Lungs clear. No tachypnea. Able to speak  in full sentences without dyspnea. Nonlabored respirations.  Skin: Skin is warm, dry and intact. Appropriate color for ethnicity. No cyanosis noted.     Oxygen saturation 97% on room air  Diagnostic Results: None.  Assessment:   Sinusitis Asthma exacerbation   Plan:   Discussed diagnosis and treatment of sinusitis and asthma exacerbation. Suggested symptomatic OTC remedies. Augmentin prescribed for sinusitis. 5-day course of prednisone for asthma exacerbation. Nasal saline spray for congestion. Rest, fluids. Avoid exposure to tobacco smoke and fumes Continued use of albuterol inhaler as needed. Follow-up with primary care provider. Side/adverse effects of medications discussed. Advised patient to use a secondary form of contraception while taking Augmentin and shortly after and explained that it can decrease the effectiveness of her birth control.  Patient verbalized understanding. Discussed red flag symptoms and circumstances with which to to seek medical care.   New Prescriptions   AMOXICILLIN-CLAVULANATE (AUGMENTIN) 875-125 MG TABLET    Take 1 tablet by mouth 2 (two) times daily for 10 days.   PREDNISONE (DELTASONE) 20 MG TABLET    Take 2 tablets (40 mg total) by mouth daily with breakfast for 5 days.

## 2018-01-28 ENCOUNTER — Telehealth: Payer: Self-pay | Admitting: Internal Medicine

## 2018-01-28 NOTE — Telephone Encounter (Signed)
No, I am sorry but I am not accepting  new patients.

## 2018-01-28 NOTE — Telephone Encounter (Unsigned)
Copied from CRM [id number]. Topic: Appointment Scheduling - New Patient >> Jan 28, 2018  2:42 PM Waymon AmatoBurton, Donna F wrote: Pt was a previous patient of Adriana SimasCook and pt is wanting to know if tullo will be pcp   Best number [phone]

## 2018-01-28 NOTE — Telephone Encounter (Signed)
Patient notified and stated she would find another provider.

## 2018-02-05 ENCOUNTER — Telehealth: Payer: Self-pay

## 2018-02-05 NOTE — Telephone Encounter (Signed)
Pt needs rx for loestrin.  Her IUD was removed and she was given samples of loestrine.  Now needs rx. [phone]  [facility]

## 2018-02-06 ENCOUNTER — Other Ambulatory Visit: Payer: Self-pay | Admitting: Obstetrics and Gynecology

## 2018-02-06 DIAGNOSIS — N921 Excessive and frequent menstruation with irregular cycle: Secondary | ICD-10-CM | POA: Insufficient documentation

## 2018-02-06 HISTORY — DX: Excessive and frequent menstruation with irregular cycle: N92.1

## 2018-02-06 MED ORDER — NORETHIN-ETH ESTRAD-FE BIPHAS 1 MG-10 MCG / 10 MCG PO TABS: 1.0000 | ORAL_TABLET | Freq: Every day | ORAL | 3 refills | Status: DC

## 2018-02-13 ENCOUNTER — Other Ambulatory Visit: Payer: Self-pay | Admitting: Family Medicine

## 2018-02-13 DIAGNOSIS — J4521 Mild intermittent asthma with (acute) exacerbation: Secondary | ICD-10-CM

## 2018-02-13 MED ORDER — ALBUTEROL SULFATE HFA 108 (90 BASE) MCG/ACT IN AERS: 2.0000 | INHALATION_SPRAY | Freq: Four times a day (QID) | RESPIRATORY_TRACT | 0 refills | Status: AC | PRN

## 2018-02-13 NOTE — Progress Notes (Signed)
Provided patient with 1 refill for albuterol inhaler.  Future refills need to come from primary care provider.

## 2018-04-29 ENCOUNTER — Encounter: Payer: Managed Care, Other (non HMO) | Attending: Physician Assistant | Admitting: Dietician

## 2018-04-29 ENCOUNTER — Encounter: Payer: Self-pay | Admitting: Dietician

## 2018-04-29 VITALS — Ht 62.0 in | Wt 154.8 lb

## 2018-04-29 DIAGNOSIS — Z713 Dietary counseling and surveillance: Secondary | ICD-10-CM | POA: Insufficient documentation

## 2018-04-29 DIAGNOSIS — R7303 Prediabetes: Secondary | ICD-10-CM

## 2018-04-29 NOTE — Patient Instructions (Addendum)
   Cook with mainly oils (olive, canola, grapeseed, etc). Another option is Ghee butter in place of stick butter  Try full fat yogurt with low fat granola/ honey/ fruit as an evening snack. [place] yogurt will provide extra protein also  Goal for fiber: 25g/day for adult females  Use the 80/20 rule of eating, where 80% of the time you are making whole-food based choices, and the other 20% is for soul foods  Monitor portion sizes slightly more than you currently are. Use the guidelines provided

## 2018-04-29 NOTE — Progress Notes (Signed)
Medical Nutrition Therapy: Visit start time: 1530  end time: 1630  Assessment:  Diagnosis: Prediabetes Past medical history: Anxiety Psychosocial issues/ stress concerns: dx of anxiety, none reported ATT  Preferred learning method:  . Auditory . Visual  Current weight: 154.8lb  Height: 5\' 2"  Medications, supplements: Xanax, Remeron, MVI, see chart for full list  Progress and evaluation: Pt reports recent prednisone rx which she suspects may have had an effect on her BG levels in recent months. (02/13/18) HgbA1c 6.2, GLU 113. Does state that her father has a h/o DM type II however. Currently weaning herself off of Remeron. She is seeking a "reasonable" plan for losing weight and eating healthy. She is intolerant to milk and does not consume caffeine. Feels that the portion sizes she consumes may be too large. Current changes to her diet include incorporating more healthy fats such as nuts on salads and olive oil when cooking, reducing portions of starches, eating more vegetables and lean protein sources, and reducing dessert items after dinner. Historically consumed sweets in response to stress.   Physical activity: 3 days per week for 30 min each session, but plans to increase to 5 days. Home workouts involving both strength training and cardio. She used to have a Systems analystpersonal trainer who taught her proper exercise techniques.  Dietary Intake:  Usual eating pattern includes 3 meals and 1-2 snacks per day. Dining out frequency: 2 meals per week.  Breakfast: 2 eggs cooked with butter + half a bagel or english muffin + half an apple Snack: not usually Lunch: brings various salads work: lettuce + shredded chicken + salsa + nonfat greek yogurt + tortilla strips; chicken salad; quinoa salad with green peppers + corn + skinless rotisserie chicken + lime juice Snack: pistaccios Supper: chicken tetrazini, chicken sausage with roasted vegetables, sheet pan recipes (chick peas + eggplant + marinara sauce +  parmesean for example), brown rice, whole wheat tortillas, zoodles occasionally, lasagna; if out: pizza, ribs, salad, Mediterranean food Snack: popcorn, pistaccios, ice cream sometimes Beverages: water  Nutrition Care Education: Topics covered: healthy and unhealthy fats, high fiber foods and daily fiber goal, stress eating, 80/20 approach to eating, portion sizes and portion control,  Basic nutrition: basic food groups, appropriate nutrient balance, appropriate meal and snack schedule, general nutrition guidelines    Weight control: benefits of weight control, behavioral changes for weight loss Advanced nutrition: cooking techniques, dining out, food label reading Other lifestyle changes: benefits of making changes, increasing motivation, identifying habits that need to change  Nutritional Diagnosis:  [place]-2.1 Inpaired nutrition utilization As related to glucose.  As evidenced by HgbA1c 6.2.  Intervention: Discussion as noted above. She desires to continue to work on a balanced approach to eating that reduces her food cravings and makes her feel satiated. She will work on paying closer attention to portion sizes, to consume more dietary fiber daily, and to reduce added sugars.   Education Materials given:  . General diet guidelines for preventing Diabetes . Goals/ instructions  Learner/ who was taught:  . Patient  Level of understanding: Marland Kitchen. Verbalizes/ demonstrates competency  Demonstrated degree of understanding via:   Teach back Learning barriers: . None  Willingness to learn/ readiness for change: . Eager, change in progress  Monitoring and Evaluation:  Dietary intake, exercise, and body weight      follow up: prn

## 2018-10-27 ENCOUNTER — Other Ambulatory Visit: Payer: Self-pay | Admitting: Physician Assistant

## 2018-10-27 DIAGNOSIS — Z1231 Encounter for screening mammogram for malignant neoplasm of breast: Secondary | ICD-10-CM

## 2018-11-12 ENCOUNTER — Ambulatory Visit
Admission: RE | Admit: 2018-11-12 | Discharge: 2018-11-12 | Disposition: A | Discharge status: Home / Self Care | Payer: Managed Care, Other (non HMO) | Source: Ambulatory Visit | Attending: Physician Assistant | Admitting: Physician Assistant

## 2018-11-12 DIAGNOSIS — Z1231 Encounter for screening mammogram for malignant neoplasm of breast: Secondary | ICD-10-CM | POA: Diagnosis present

## 2018-11-25 NOTE — Progress Notes (Signed)
Gynecology Annual Exam  PCP: Patrice Paradise, MD  Chief Complaint:  Chief Complaint  Patient presents with  . Gynecologic Exam    History of Present Illness:Sarah Mahoney presents today for her annual exam. She is a 48 year old Caucasian/White female, G1 P1001, whose LMP was 10/29/2017.Marland Kitchen She has been amenorrheic since her Mirena IUD was removed and she started on Lo Loestrin one year ago.  The patient's past medical history is notable for a history of Allergic rhinitis, Anxiety, and Major Depression (Recurrent). Since her last annual GYN exam dated 06/11/2017, she has had no significant changes in her health. She is sexually active. She is currently using OCPs for contraception.  Her most recent pap smear was obtained 06/11/2018 and was NIL. Her most recent mammogram obtained on 11/12/2018 was normal. There is no family history of breast cancer. There is no family history of ovarian cancer. The patient does do monthly self breast exams.  The patient does not smoke.  The patient does drink rarely.  The patient does not use illegal drugs.  She just started back exercising this week-both cardio and strength training  The patient does get adequate calcium in her diet.  She had a recent cholesterol screen in 2020 that was normal.      Review of Systems: Review of Systems  Constitutional: Negative for chills, fever and weight loss (of 9#).  HENT: Negative for congestion, sinus pain and sore throat.   Eyes: Negative for blurred vision and pain.  Respiratory: Negative for hemoptysis, shortness of breath and wheezing.   Cardiovascular: Negative for chest pain, palpitations and leg swelling.  Gastrointestinal: Negative for abdominal pain, blood in stool, constipation, diarrhea, heartburn, nausea and vomiting.  Genitourinary: Negative for dysuria, frequency, hematuria and urgency.  Musculoskeletal: Negative for back pain, joint pain and myalgias.  Skin: Negative for itching and  rash.  Neurological: Negative for dizziness, tingling and headaches.  Endo/Heme/Allergies: Negative for environmental allergies and polydipsia. Does not bruise/bleed easily.       Negative for hirsutism   Psychiatric/Behavioral: Negative for depression. The patient is nervous/anxious. The patient does not have insomnia.     Past Medical History:  Past Medical History:  Diagnosis Date  . Allergy   . Anxiety   . Asthma   . Chicken pox   . Depression     Past Surgical History:  Past Surgical History:  Procedure Laterality Date  . ADENOIDECTOMY  1987  . TONSILLECTOMY  1987  . WISDOM TOOTH EXTRACTION      Family History:  Family History  Problem Relation Age of Onset  . Arthritis Mother   . Arthritis Father   . Diabetes Father   . Heart disease Maternal Aunt   . Sudden Cardiac Death Maternal Aunt   . Arthritis Maternal Grandmother   . Hypertension Maternal Grandmother   . Anxiety disorder Maternal Grandmother   . Arthritis Maternal Grandfather   . Arthritis Paternal Grandmother   . Depression Paternal Grandmother   . Arthritis Paternal Grandfather   . Heart disease Paternal Grandfather   . Sudden Cardiac Death Paternal Grandfather   . Breast cancer Neg Hx     Social History:  Social History   Socioeconomic History  . Marital status: Married    Spouse name: Not on file  . Number of children: 1  . Years of education: Not on file  . Highest education level: Not on file  Occupational History  . Not on file  Social Needs  . Financial resource strain: Not on file  . Food insecurity:    Worry: Not on file    Inability: Not on file  . Transportation needs:    Medical: Not on file    Non-medical: Not on file  Tobacco Use  . Smoking status: Never Smoker  . Smokeless tobacco: Never Used  Substance and Sexual Activity  . Alcohol use: Yes    Comment: rare  . Drug use: No  . Sexual activity: Yes    Partners: Male    Birth control/protection: Pill  Lifestyle  .  Physical activity:    Days per week: Not on file    Minutes per session: Not on file  . Stress: Not on file  Relationships  . Social connections:    Talks on phone: Not on file    Gets together: Not on file    Attends religious service: Not on file    Active member of club or organization: Not on file    Attends meetings of clubs or organizations: Not on file    Relationship status: Not on file  . Intimate partner violence:    Fear of current or ex partner: Not on file    Emotionally abused: Not on file    Physically abused: Not on file    Forced sexual activity: Not on file  Other Topics Concern  . Not on file  Social History Narrative  . Not on file    Allergies:  No Known Allergies  Medications: Current Outpatient Medications on File Prior to Visit  Medication Sig Dispense Refill  . albuterol (PROVENTIL HFA;VENTOLIN HFA) 108 (90 Base) MCG/ACT inhaler Inhale 2 puffs into the lungs every 6 (six) hours as needed for wheezing or shortness of breath (cough, or shortness of breath). 1 Inhaler 0  . ALPRAZolam (XANAX) 0.25 MG tablet TAKE 1 TABLET BY MOUTH TWICE A DAY AT 8AM AND 5PM AS NEEDED  1  . busPIRone (BUSPAR) 15 MG tablet Take by mouth.    . fluticasone (FLONASE) 50 MCG/ACT nasal spray   4  . fluticasone (FLOVENT HFA) 110 MCG/ACT inhaler Inhale into the lungs.    . mirtazapine (REMERON) 30 MG tablet Take by mouth.    . Multiple Vitamin (MULTI-VITAMINS) TABS Take by mouth.    . triamcinolone ointment (KENALOG) 0.1 % Apply 1 application topically 2 (two) times daily. 30 g 0   No current facility-administered medications on file prior to visit.          Physical Exam Vitals:BP 100/70   Pulse 90   Ht 5\' 2"  (1.575 m)   Wt 152 lb (68.9 kg)   BMI 27.80 kg/m   General: WF in  NAD HEENT: normocephalic, anicteric Neck: no thyroid enlargement, no palpable nodules, no cervical lymphadenopathy  Pulmonary: No increased work of breathing, CTAB Cardiovascular: RRR, without  murmur  Breast: Breast symmetrical, no tenderness, no palpable nodules or masses, no skin or nipple retraction present, no nipple discharge.  No axillary, infraclavicular or supraclavicular lymphadenopathy. Abdomen: Soft, non-tender, non-distended.  Umbilicus without lesions.  No hepatomegaly or masses palpable. No evidence of hernia. Genitourinary:  External: Normal external female genitalia.  Normal urethral meatus, normal Bartholin's and Skene's glands.    Vagina: Normal vaginal mucosa, no evidence of prolapse.    Cervix: Grossly normal in appearance, no bleeding, non-tender  Uterus: Anteverted, normal size, shape, and consistency, mobile, and non-tender  Adnexa: No adnexal masses, non-tender  Rectal: deferred  Lymphatic: no evidence  of inguinal lymphadenopathy Extremities: no edema, erythema, or tenderness Neurologic: Grossly intact Psychiatric: mood appropriate, affect full     Assessment: 48 y.o. G1P1001 annual gyn exam Amenorrheic on Lo Loestrin  Plan:   1) Breast cancer screening - recommend monthly self breast exam and annual mammograms.. Mammogram is UTD.  2) Contraception-discussed options. Would like to remain on Lo Loestrin  3) Cervical cancer screening - Pap was done  4) Routine healthcare maintenance including cholesterol and diabetes screening managed by PCP   5) Colon cancer screening-discussed options. Would like to do Cologuard. Order sent to company.  6) RTO 1 year for annual and prn  Farrel Conners, CNM

## 2018-11-27 ENCOUNTER — Encounter: Payer: Self-pay | Admitting: Certified Nurse Midwife

## 2018-11-27 ENCOUNTER — Other Ambulatory Visit (HOSPITAL_COMMUNITY)
Admission: RE | Admit: 2018-11-27 | Discharge: 2018-11-27 | Disposition: A | Discharge status: Home / Self Care | Payer: Managed Care, Other (non HMO) | Source: Ambulatory Visit | Attending: Certified Nurse Midwife | Admitting: Certified Nurse Midwife

## 2018-11-27 ENCOUNTER — Ambulatory Visit (INDEPENDENT_AMBULATORY_CARE_PROVIDER_SITE_OTHER): Payer: Managed Care, Other (non HMO) | Admitting: Certified Nurse Midwife

## 2018-11-27 VITALS — BP 100/70 | HR 90 | Ht 62.0 in | Wt 152.0 lb

## 2018-11-27 DIAGNOSIS — N912 Amenorrhea, unspecified: Secondary | ICD-10-CM

## 2018-11-27 DIAGNOSIS — Z01419 Encounter for gynecological examination (general) (routine) without abnormal findings: Secondary | ICD-10-CM

## 2018-11-27 DIAGNOSIS — Z793 Long term (current) use of hormonal contraceptives: Secondary | ICD-10-CM

## 2018-11-27 DIAGNOSIS — Z124 Encounter for screening for malignant neoplasm of cervix: Secondary | ICD-10-CM | POA: Insufficient documentation

## 2018-11-27 DIAGNOSIS — Z1211 Encounter for screening for malignant neoplasm of colon: Secondary | ICD-10-CM

## 2018-11-27 MED ORDER — NORETHIN-ETH ESTRAD-FE BIPHAS 1 MG-10 MCG / 10 MCG PO TABS: 1.0000 | ORAL_TABLET | Freq: Every day | ORAL | 3 refills | Status: DC

## 2018-12-01 LAB — CYTOLOGY - PAP
DIAGNOSIS: NEGATIVE
HPV (WINDOPATH): NOT DETECTED

## 2018-12-20 DIAGNOSIS — Z1211 Encounter for screening for malignant neoplasm of colon: Secondary | ICD-10-CM

## 2018-12-20 HISTORY — DX: Encounter for screening for malignant neoplasm of colon: Z12.11

## 2019-01-02 LAB — COLOGUARD: Cologuard: NEGATIVE

## 2019-01-27 ENCOUNTER — Other Ambulatory Visit: Payer: Self-pay | Admitting: Obstetrics and Gynecology

## 2019-01-27 DIAGNOSIS — Z793 Long term (current) use of hormonal contraceptives: Principal | ICD-10-CM

## 2019-01-27 DIAGNOSIS — N912 Amenorrhea, unspecified: Secondary | ICD-10-CM

## 2019-01-28 NOTE — Telephone Encounter (Signed)
Annual done by [facility] 11/2018. Please advise

## 2019-11-15 ENCOUNTER — Other Ambulatory Visit: Payer: Self-pay | Admitting: Certified Nurse Midwife

## 2019-12-14 ENCOUNTER — Encounter: Payer: Self-pay | Admitting: Certified Nurse Midwife

## 2019-12-14 ENCOUNTER — Other Ambulatory Visit: Payer: Self-pay

## 2019-12-14 ENCOUNTER — Ambulatory Visit (INDEPENDENT_AMBULATORY_CARE_PROVIDER_SITE_OTHER): Payer: Managed Care, Other (non HMO) | Admitting: Certified Nurse Midwife

## 2019-12-14 VITALS — BP 130/76 | HR 66 | Ht 62.0 in | Wt 154.0 lb

## 2019-12-14 DIAGNOSIS — Z01419 Encounter for gynecological examination (general) (routine) without abnormal findings: Secondary | ICD-10-CM | POA: Diagnosis not present

## 2019-12-14 DIAGNOSIS — Z1239 Encounter for other screening for malignant neoplasm of breast: Secondary | ICD-10-CM

## 2019-12-14 NOTE — Progress Notes (Signed)
Gynecology Annual Exam  PCP: Marinda Elk, MD  Chief Complaint:  Chief Complaint  Patient presents with  . Gynecologic Exam    History of Present Illness:Sarah Mahoney presents today for her annual exam. She is a 49 year old Caucasian/White female, G1 P1001, whose LMP was 10/29/2017. She was amenorrheic on her Lo Loestrin. She stopped taking the OCP in September 2020 due to irritability on the pill. Her menses started back after 3 mos and have been monthly and last 5 days with a moderate flow. She has some mild cramping on the first day of her menses that resolves with 400 mgm of Advil.  The patient's past medical history is notable for a history of Allergic rhinitis, Anxiety, and Major Depression (Recurrent). Since her last annual GYN exam dated 11/27/2018, she has had no significant changes in her health. She is no longer taking Remeron and she feels she has been doing well off the antidepressant.  She is sexually active. She is currently using condoms for contraception.  Her most recent pap smear was obtained 11/27/2018 and was NIL/ negative HRHPV.Marland Kitchen Her most recent mammogram obtained on 11/12/2018 was normal. There is no family history of breast cancer. There is no family history of ovarian cancer. Mother was diagnosed with uterine cancer last year at the age of 78.  The patient does do monthly self breast exams.  Cologuard last year was negative. The patient does not smoke.  The patient does drink rarely.  The patient does not use illegal drugs.  She has been exercising this week-both cardio (walking 2x/wk and strength training (FIT ON app for 20-30 min, three times a week).  The patient does get adequate calcium in her diet.  She had a recent cholesterol screen in 2020 that was normal.      Review of Systems: Review of Systems  Constitutional: Negative for chills, fever and weight loss.  HENT: Positive for congestion. Negative for sinus pain and sore throat.   Eyes:  Negative for blurred vision and pain.  Respiratory: Negative for hemoptysis, shortness of breath and wheezing.   Cardiovascular: Negative for chest pain, palpitations and leg swelling.  Gastrointestinal: Negative for abdominal pain, blood in stool, constipation, diarrhea, heartburn, nausea and vomiting.  Genitourinary: Negative for dysuria, frequency, hematuria and urgency.  Musculoskeletal: Negative for back pain, joint pain and myalgias.  Skin: Negative for itching and rash.  Neurological: Negative for dizziness, tingling and headaches.  Endo/Heme/Allergies: Negative for environmental allergies and polydipsia. Does not bruise/bleed easily.          Psychiatric/Behavioral: Negative for depression. The patient is nervous/anxious. The patient does not have insomnia.     Past Medical History:  Past Medical History:  Diagnosis Date  . Allergy   . Anxiety   . Asthma   . Chicken pox   . Depression     Past Surgical History:  Past Surgical History:  Procedure Laterality Date  . ADENOIDECTOMY  1987  . TONSILLECTOMY  1987  . WISDOM TOOTH EXTRACTION      Family History:  Family History  Problem Relation Age of Onset  . Arthritis Mother   . Cancer Mother 79       endometrial uterine   . Arthritis Father   . Diabetes Father   . Heart disease Maternal Aunt   . Sudden Cardiac Death Maternal Aunt   . Arthritis Maternal Grandmother   . Hypertension Maternal Grandmother   . Anxiety disorder Maternal  Grandmother   . Arthritis Maternal Grandfather   . Arthritis Paternal Grandmother   . Depression Paternal Grandmother   . Arthritis Paternal Grandfather   . Heart disease Paternal Grandfather   . Sudden Cardiac Death Paternal Grandfather   . Breast cancer Neg Hx     Social History:  Social History   Socioeconomic History  . Marital status: Married    Spouse name: Not on file  . Number of children: 1  . Years of education: Not on file  . Highest education level: Not on file    Occupational History  . Not on file  Tobacco Use  . Smoking status: Never Smoker  . Smokeless tobacco: Never Used  Substance and Sexual Activity  . Alcohol use: Yes    Comment: rare  . Drug use: No  . Sexual activity: Yes    Partners: Male    Birth control/protection: None, Condom  Other Topics Concern  . Not on file  Social History Narrative  . Not on file   Social Determinants of Health   Financial Resource Strain:   . Difficulty of Paying Living Expenses: Not on file  Food Insecurity:   . Worried About Programme researcher, broadcasting/film/video in the Last Year: Not on file  . Ran Out of Food in the Last Year: Not on file  Transportation Needs:   . Lack of Transportation (Medical): Not on file  . Lack of Transportation (Non-Medical): Not on file  Physical Activity:   . Days of Exercise per Week: Not on file  . Minutes of Exercise per Session: Not on file  Stress:   . Feeling of Stress : Not on file  Social Connections:   . Frequency of Communication with Friends and Family: Not on file  . Frequency of Social Gatherings with Friends and Family: Not on file  . Attends Religious Services: Not on file  . Active Member of Clubs or Organizations: Not on file  . Attends Banker Meetings: Not on file  . Marital Status: Not on file  Intimate Partner Violence:   . Fear of Current or Ex-Partner: Not on file  . Emotionally Abused: Not on file  . Physically Abused: Not on file  . Sexually Abused: Not on file    Allergies:  No Known Allergies  Medications:  Current Outpatient Medications:  .  albuterol (PROVENTIL HFA;VENTOLIN HFA) 108 (90 Base) MCG/ACT inhaler, Inhale 2 puffs into the lungs every 6 (six) hours as needed for wheezing or shortness of breath (cough, or shortness of breath)., Disp: 1 Inhaler, Rfl: 0 .  ALPRAZolam (XANAX) 0.25 MG tablet, TAKE 1 TABLET BY MOUTH TWICE A DAY AT 8AM AND 5PM AS NEEDED, Disp: , Rfl: 1 .  busPIRone (BUSPAR) 15 MG tablet, Take by mouth., Disp: ,  Rfl:  .  fluticasone (FLONASE) 50 MCG/ACT nasal spray, , Disp: , Rfl: 4 .  Halcinonide (HALOG) 0.1 % CREA, Apply topically., Disp: , Rfl:  .  Multiple Vitamin (MULTI-VITAMINS) TABS, Take by mouth., Disp: , Rfl:  .  fluticasone (FLOVENT HFA) 110 MCG/ACT inhaler, Inhale into the lungs., Disp: , Rfl:        Physical Exam Vitals:BP 130/76   Pulse 66   Ht 5\' 2"  (1.575 m)   Wt 154 lb (69.9 kg)   LMP 12/08/2019 (Exact Date)   BMI 28.17 kg/m   General: WF in  NAD HEENT: normocephalic, anicteric Neck: no thyroid enlargement, no palpable nodules, no cervical lymphadenopathy  Pulmonary: No increased  work of breathing, CTAB Cardiovascular: RRR, without murmur  Breast: Breast symmetrical, no tenderness, no palpable nodules or masses, no skin or nipple retraction present, no nipple discharge.  No axillary, infraclavicular or supraclavicular lymphadenopathy. Abdomen: Soft, non-tender, non-distended.  Umbilicus without lesions.  No hepatomegaly or masses palpable. No evidence of hernia. Genitourinary:  External: Normal external female genitalia.  Normal urethral meatus, normal Bartholin's and Skene's glands.    Vagina: Normal vaginal mucosa, no evidence of prolapse.    Cervix:  no bleeding, non-tender  Uterus: Anteverted, normal size, shape, and consistency, mobile, and non-tender  Adnexa: No adnexal masses, non-tender  Rectal: deferred  Lymphatic: no evidence of inguinal lymphadenopathy Extremities: no edema, erythema, or tenderness Neurologic: Grossly intact Psychiatric: mood appropriate, affect full     Assessment: 49 y.o. G1P1001 annual gyn exam  Plan:   1) Breast cancer screening - recommend monthly self breast exam and annual mammograms.. Mammogram ordered today  2) Contraception-condoms  3) Cervical cancer screening - Pap was not done. Desires Pap every 3 years.  4) Routine healthcare maintenance including cholesterol and diabetes screening managed by PCP   5) Colon  cancer screening- Cologuard negative last year. Repeat every 3 years if desires to continue with this method of screening.   6) RTO 1 year for annual and prn  Farrel Conners, CNM

## 2019-12-15 ENCOUNTER — Encounter: Payer: Self-pay | Admitting: Certified Nurse Midwife

## 2020-02-25 ENCOUNTER — Ambulatory Visit
Admission: RE | Admit: 2020-02-25 | Discharge: 2020-02-25 | Disposition: A | Discharge status: Home / Self Care | Payer: Managed Care, Other (non HMO) | Source: Ambulatory Visit | Attending: Certified Nurse Midwife | Admitting: Certified Nurse Midwife

## 2020-02-25 DIAGNOSIS — Z1231 Encounter for screening mammogram for malignant neoplasm of breast: Secondary | ICD-10-CM | POA: Insufficient documentation

## 2020-02-25 DIAGNOSIS — Z1239 Encounter for other screening for malignant neoplasm of breast: Secondary | ICD-10-CM

## 2020-12-13 ENCOUNTER — Encounter: Payer: Self-pay | Admitting: Obstetrics and Gynecology

## 2020-12-13 NOTE — Progress Notes (Signed)
PCP: Sarah Paradise, MD   Chief Complaint  Patient presents with  . Gynecologic Exam    Qs on irregular cycles and perimenopause    HPI:      [name] is a 50 y.o. G1P1001 whose LMP was Patient's last menstrual period was 11/30/2020 (approximate)., presents today for her annual examination.  Her menses are irregular now every 2-4 wks, lasting 4-5 days light flow.  Dysmenorrhea mild. Missed 1/22 menses. Has occas vasomotor sx, feels hotter in general.  No recent thyroid labs.   Sex activity: single partner, contraception - condoms. She does not have vaginal dryness.  Last Pap: 11/27/18  Results were: no abnormalities /neg HPV DNA.  Hx of STDs: none  Last mammogram: 02/25/20  Results were: normal--routine follow-up in 12 months There is no FH of breast cancer. There is no FH of ovarian cancer. The patient does do self-breast exams.  Colonoscopy: NEG Cologuard 12/29/18,  Repeat due after 3 years.   Tobacco use: The patient denies current or previous tobacco use. Alcohol use: none  No drug use Exercise: moderately active  She does get adequate calcium and Vitamin D in her diet.  Labs with PCP.   Past Medical History:  Diagnosis Date  . Allergy   . Anxiety   . Asthma   . Chicken pox   . Depression   . Menorrhagia with irregular cycle 02/06/2018  . Screening for colon cancer 12/2018   NEG Cologuard; repeat after 3 yrs    Past Surgical History:  Procedure Laterality Date  . ADENOIDECTOMY  1987  . TONSILLECTOMY  1987  . WISDOM TOOTH EXTRACTION      Family History  Problem Relation Age of Onset  . Arthritis Mother   . Cancer Mother 81       endometrial uterine   . Arthritis Father   . Diabetes Father   . Heart disease Maternal Aunt   . Sudden Cardiac Death Maternal Aunt   . Arthritis Maternal Grandmother   . Hypertension Maternal Grandmother   . Anxiety disorder Maternal Grandmother   . Arthritis Maternal Grandfather   . Arthritis Paternal  Grandmother   . Depression Paternal Grandmother   . Arthritis Paternal Grandfather   . Heart disease Paternal Grandfather   . Sudden Cardiac Death Paternal Grandfather   . Breast cancer Neg Hx     Social History   Socioeconomic History  . Marital status: Married    Spouse name: Not on file  . Number of children: 1  . Years of education: Not on file  . Highest education level: Not on file  Occupational History  . Not on file  Tobacco Use  . Smoking status: Never Smoker  . Smokeless tobacco: Never Used  Vaping Use  . Vaping Use: Never used  Substance and Sexual Activity  . Alcohol use: Yes    Comment: rare  . Drug use: No  . Sexual activity: Yes    Partners: Male    Birth control/protection: None, Condom  Other Topics Concern  . Not on file  Social History Narrative  . Not on file   Social Determinants of Health   Financial Resource Strain: Not on file  Food Insecurity: Not on file  Transportation Needs: Not on file  Physical Activity: Not on file  Stress: Not on file  Social Connections: Not on file  Intimate Partner Violence: Not on file     Current Outpatient Medications:  .  albuterol (PROVENTIL HFA;VENTOLIN  HFA) 108 (90 Base) MCG/ACT inhaler, Inhale 2 puffs into the lungs every 6 (six) hours as needed for wheezing or shortness of breath (cough, or shortness of breath)., Disp: 1 Inhaler, Rfl: 0 .  ALPRAZolam (XANAX) 0.25 MG tablet, TAKE 1 TABLET BY MOUTH TWICE A DAY AT 8AM AND 5PM AS NEEDED, Disp: , Rfl: 1 .  busPIRone (BUSPAR) 15 MG tablet, Take by mouth., Disp: , Rfl:  .  Cholecalciferol 125 MCG (5000 UT) TABS, Take by mouth., Disp: , Rfl:  .  citalopram (CELEXA) 10 MG tablet, , Disp: , Rfl:  .  fluticasone (FLONASE) 50 MCG/ACT nasal spray, , Disp: , Rfl: 4 .  Halcinonide 0.1 % CREA, Apply topically., Disp: , Rfl:  .  Multiple Vitamin (MULTI-VITAMINS) TABS, Take by mouth., Disp: , Rfl:      ROS:  Review of Systems  Constitutional: Negative for  fatigue, fever and unexpected weight change.  Respiratory: Negative for cough, shortness of breath and wheezing.   Cardiovascular: Negative for chest pain, palpitations and leg swelling.  Gastrointestinal: Negative for blood in stool, constipation, diarrhea, nausea and vomiting.  Endocrine: Negative for cold intolerance, heat intolerance and polyuria.  Genitourinary: Negative for dyspareunia, dysuria, flank pain, frequency, genital sores, hematuria, menstrual problem, pelvic pain, urgency, vaginal bleeding, vaginal discharge and vaginal pain.  Musculoskeletal: Negative for back pain, joint swelling and myalgias.  Skin: Negative for rash.  Neurological: Negative for dizziness, syncope, light-headedness, numbness and headaches.  Hematological: Negative for adenopathy.  Psychiatric/Behavioral: Negative for agitation, confusion, sleep disturbance and suicidal ideas. The patient is not nervous/anxious.   BREAST: No symptoms    Objective: BP 100/60   Ht 5\' 2"  (1.575 m)   Wt 152 lb (68.9 kg)   LMP 11/30/2020 (Approximate)   BMI 27.80 kg/m    Physical Exam Constitutional:      Appearance: She is well-developed.  Genitourinary:     Vulva normal.     Right Labia: No rash, tenderness or lesions.    Left Labia: No tenderness, lesions or rash.    No vaginal discharge, erythema or tenderness.      Right Adnexa: not tender and no mass present.    Left Adnexa: not tender and no mass present.    No cervical friability or polyp.     Uterus is not enlarged or tender.  Breasts:     Right: No mass, nipple discharge, skin change or tenderness.     Left: No mass, nipple discharge, skin change or tenderness.    Neck:     Thyroid: No thyromegaly.  Cardiovascular:     Rate and Rhythm: Normal rate and regular rhythm.     Heart sounds: Normal heart sounds. No murmur heard.   Pulmonary:     Effort: Pulmonary effort is normal.     Breath sounds: Normal breath sounds.  Abdominal:      Palpations: Abdomen is soft.     Tenderness: There is no abdominal tenderness. There is no guarding or rebound.  Musculoskeletal:        General: Normal range of motion.     Cervical back: Normal range of motion.  Lymphadenopathy:     Cervical: No cervical adenopathy.  Neurological:     General: No focal deficit present.     Mental Status: She is alert and oriented to person, place, and time.     Cranial Nerves: No cranial nerve deficit.  Skin:    General: Skin is warm and dry.  Psychiatric:  Mood and Affect: Mood normal.        Behavior: Behavior normal.        Thought Content: Thought content normal.        Judgment: Judgment normal.  Vitals reviewed.     Assessment/Plan:  Encounter for annual routine gynecological examination  Encounter for screening mammogram for malignant neoplasm of breast - Plan: MM 3D SCREEN BREAST BILATERAL; pt to sched mammo  Abnormal uterine bleeding (AUB) - Plan: TSH + free T4, US PELVIS TRANSVAGINAL NON-OB (TV ONLY); check labs and Gyn u/s. Will f/u with results. If neg, most likely due to perimenopause. Can watch and wait vs hormonal options.   Thyroid disorder screening - Plan: TSH + free T4          GYN counsel breast self exam, mammography screening, menopause, adequate intake of calcium and vitamin D, diet and exercise    F/U  Return in about 2 weeks (around 12/28/2020) for GYN u/ s for AUB (not urgent)--[facility] to call pt.  Allyn Bertoni B. Kelin Nixon, PA-C 12/14/2020 9:27 AM

## 2020-12-14 ENCOUNTER — Encounter: Payer: Self-pay | Admitting: Obstetrics and Gynecology

## 2020-12-14 ENCOUNTER — Other Ambulatory Visit: Payer: Self-pay

## 2020-12-14 ENCOUNTER — Ambulatory Visit (INDEPENDENT_AMBULATORY_CARE_PROVIDER_SITE_OTHER): Payer: Managed Care, Other (non HMO) | Admitting: Obstetrics and Gynecology

## 2020-12-14 VITALS — BP 100/60 | Ht 62.0 in | Wt 152.0 lb

## 2020-12-14 DIAGNOSIS — Z1231 Encounter for screening mammogram for malignant neoplasm of breast: Secondary | ICD-10-CM | POA: Diagnosis not present

## 2020-12-14 DIAGNOSIS — Z1329 Encounter for screening for other suspected endocrine disorder: Secondary | ICD-10-CM | POA: Diagnosis not present

## 2020-12-14 DIAGNOSIS — N939 Abnormal uterine and vaginal bleeding, unspecified: Secondary | ICD-10-CM | POA: Diagnosis not present

## 2020-12-14 DIAGNOSIS — Z01419 Encounter for gynecological examination (general) (routine) without abnormal findings: Secondary | ICD-10-CM | POA: Diagnosis not present

## 2020-12-14 NOTE — Patient Instructions (Addendum)
I value your feedback and you entrusting us with your care. If you get a [facility] patient survey, I would appreciate you taking the time to let us know about your experience today. Thank you!  [facility]: [phone]      

## 2020-12-15 LAB — TSH+FREE T4
Free T4: 0.88 ng/dL (ref 0.82–1.77)
TSH: 1.19 u[IU]/mL (ref 0.450–4.500)

## 2021-01-02 ENCOUNTER — Telehealth: Payer: Self-pay | Admitting: Obstetrics and Gynecology

## 2021-01-02 ENCOUNTER — Ambulatory Visit (INDEPENDENT_AMBULATORY_CARE_PROVIDER_SITE_OTHER): Payer: Managed Care, Other (non HMO)

## 2021-01-02 ENCOUNTER — Other Ambulatory Visit: Payer: Self-pay

## 2021-01-02 DIAGNOSIS — N939 Abnormal uterine and vaginal bleeding, unspecified: Secondary | ICD-10-CM

## 2021-01-02 NOTE — Telephone Encounter (Addendum)
Pt aware of neg GYN u/s results for mid cycle spotting/bleeding. Neg thyroid labs. Sx are not too frequent or bothersome. Pt wants to watch and wait for now. Also discussed IUD, hormones. Pt to f/u prn.    ULTRASOUND REPORT  Location: [facility]  Date of Service: 01/02/2021    Indications:Abnormal Uterine Bleeding Findings:  The uterus is anteverted and measures 7.7 x 4.5 x 4.0cm. Echo texture is homogenous without evidence of focal masses.  The Endometrium measures 6.6 mm.  Right Ovary is not seen. Left Ovary measures 2.8 x 2.1 x 1.9 cm with two dominant follicles Survey of the adnexa demonstrates no adnexal masses. There is no free fluid in the cul de sac.  Impression: 1. Normal pelvic ultrasound.   Recommendations: 1.Clinical correlation with the patient's History and Physical Exam.   Darlina Guys, RT

## 2021-05-11 ENCOUNTER — Ambulatory Visit
Admission: RE | Admit: 2021-05-11 | Discharge: 2021-05-11 | Disposition: A | Discharge status: Home / Self Care | Payer: Managed Care, Other (non HMO) | Source: Ambulatory Visit | Attending: Obstetrics and Gynecology | Admitting: Obstetrics and Gynecology

## 2021-05-11 ENCOUNTER — Other Ambulatory Visit: Payer: Self-pay

## 2021-05-11 DIAGNOSIS — Z1231 Encounter for screening mammogram for malignant neoplasm of breast: Secondary | ICD-10-CM

## 2021-08-23 ENCOUNTER — Ambulatory Visit: Payer: Managed Care, Other (non HMO) | Admitting: Dermatology

## 2022-02-09 LAB — COLOGUARD: COLOGUARD: NEGATIVE

## 2022-02-10 IMAGING — MG DIGITAL SCREENING BILAT W/ TOMO W/ CAD
8 series · 8 of 24 positions shown · non-contrast
Comparison: Previous exam(s).

CLINICAL DATA: Screening.

EXAM:
DIGITAL SCREENING BILATERAL MAMMOGRAM WITH TOMO AND CAD

[R MLO synth-2D]
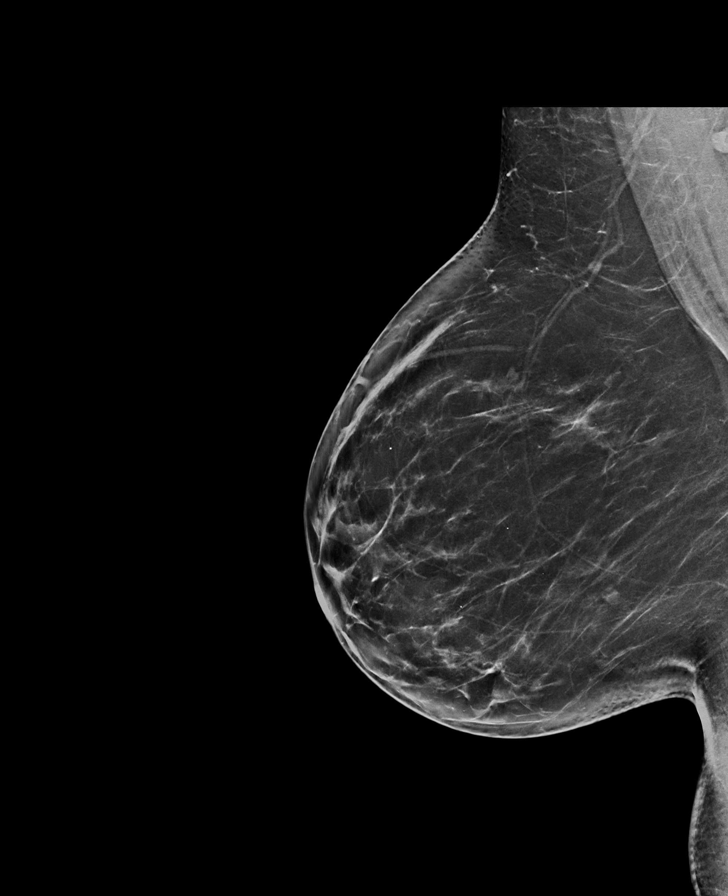

[L MLO synth-2D]
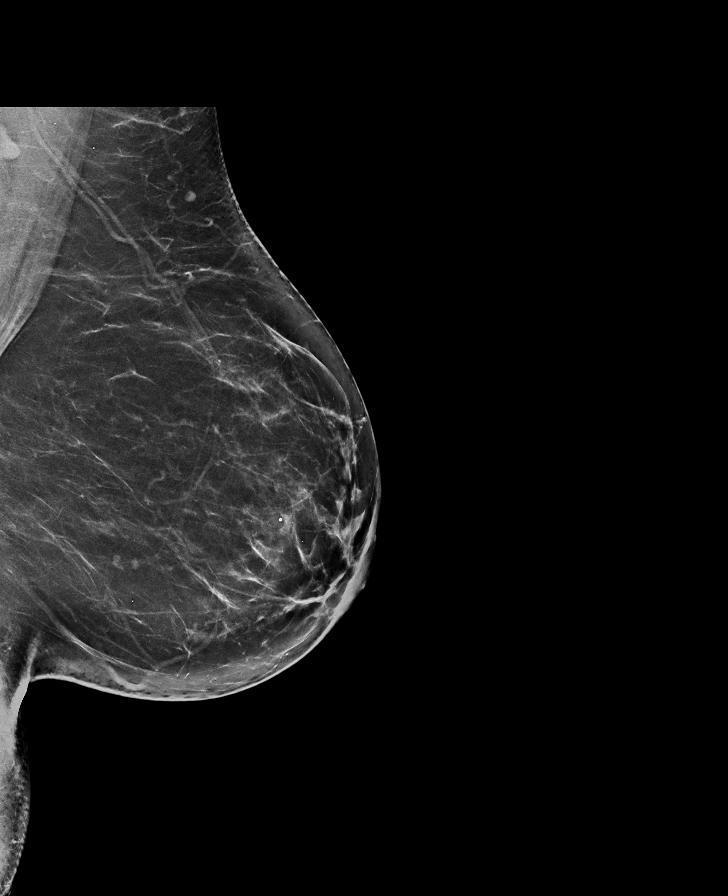

[R CC synth-2D]
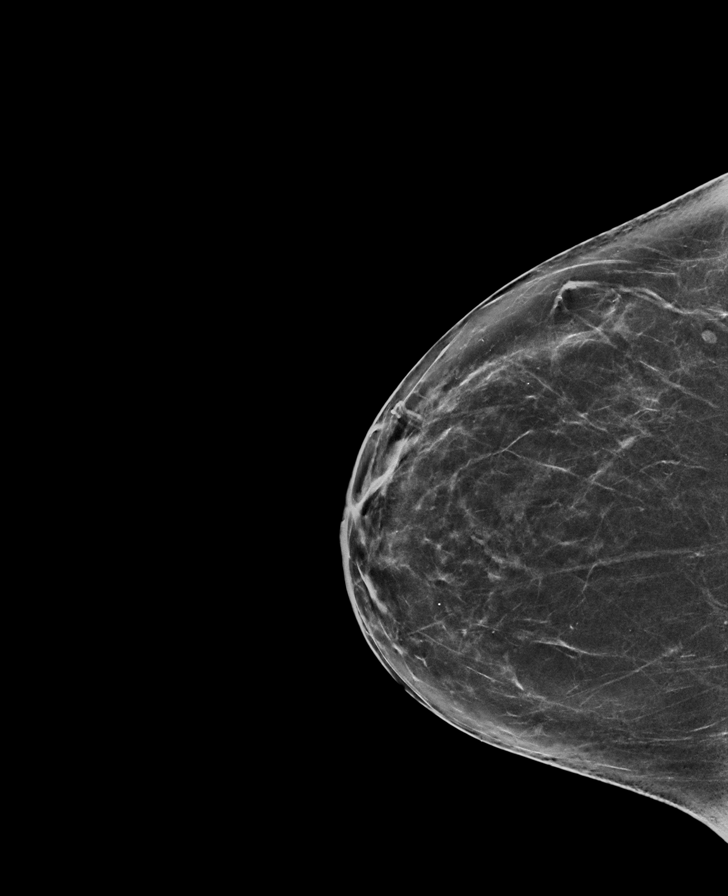

[L CC synth-2D]
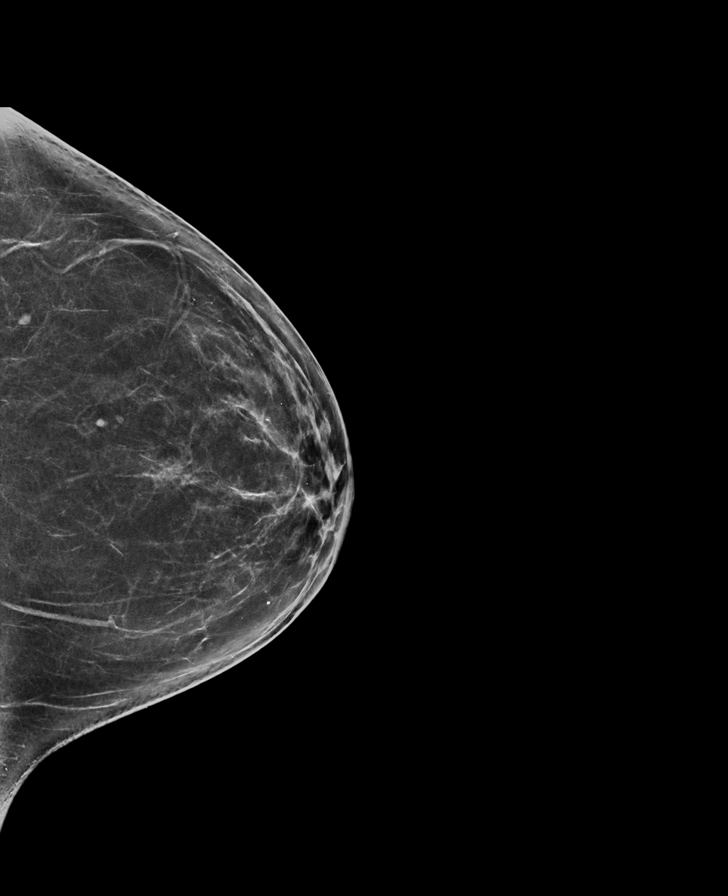

[L CC tomo · tomo slice 35/69.0]
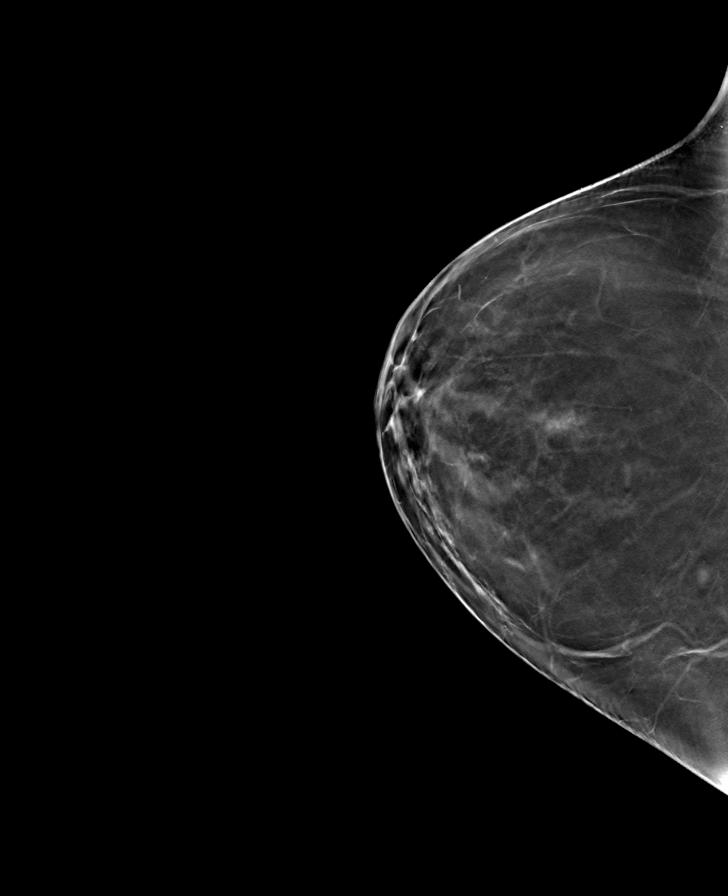

[R MLO tomo · tomo slice 39/76.0]
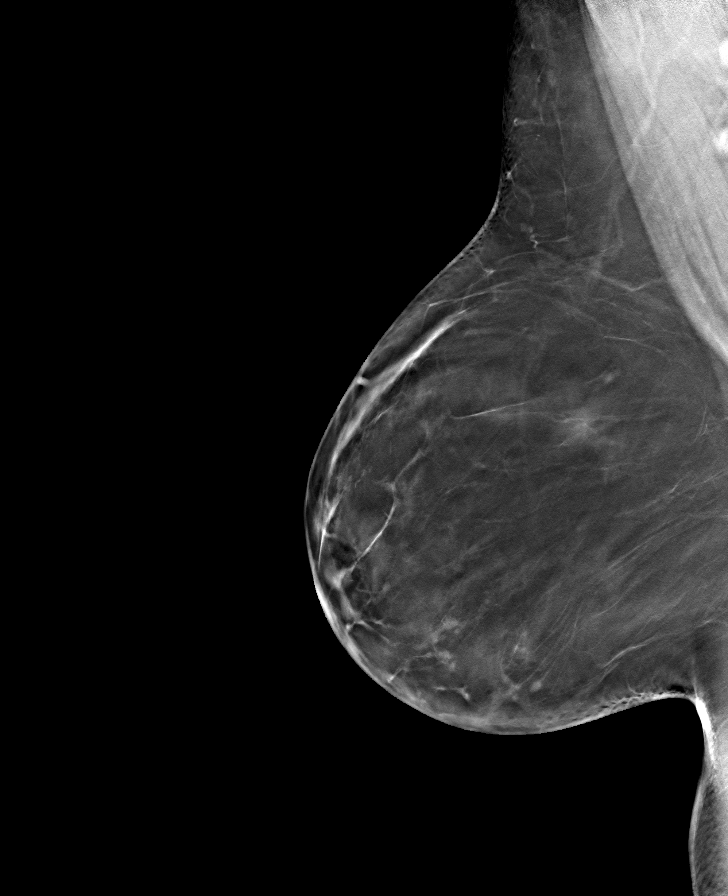

[R CC tomo · tomo slice 35/69.0]
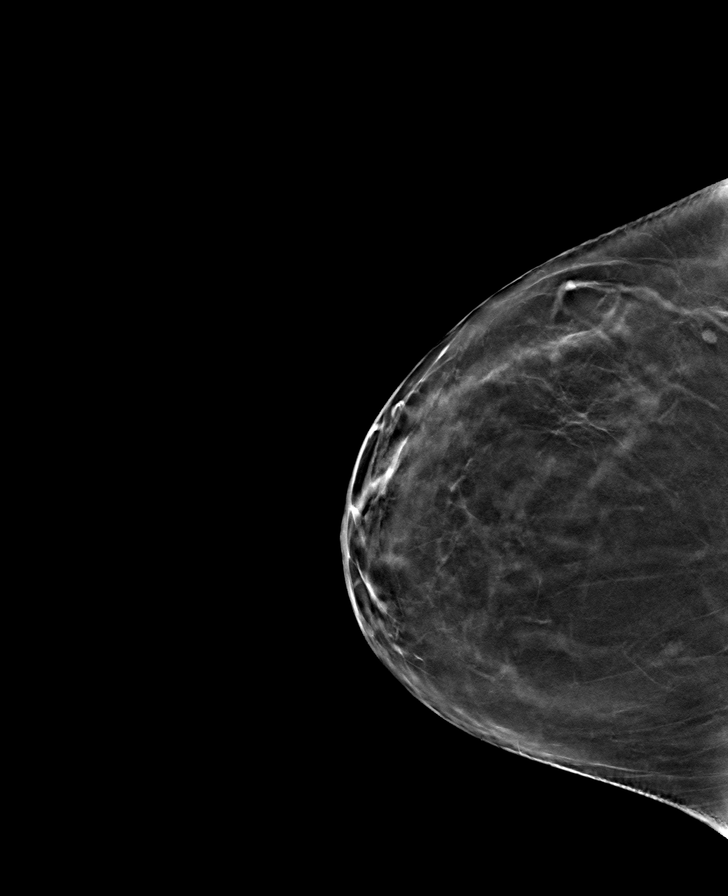

[L MLO tomo · tomo slice 39/77.0]
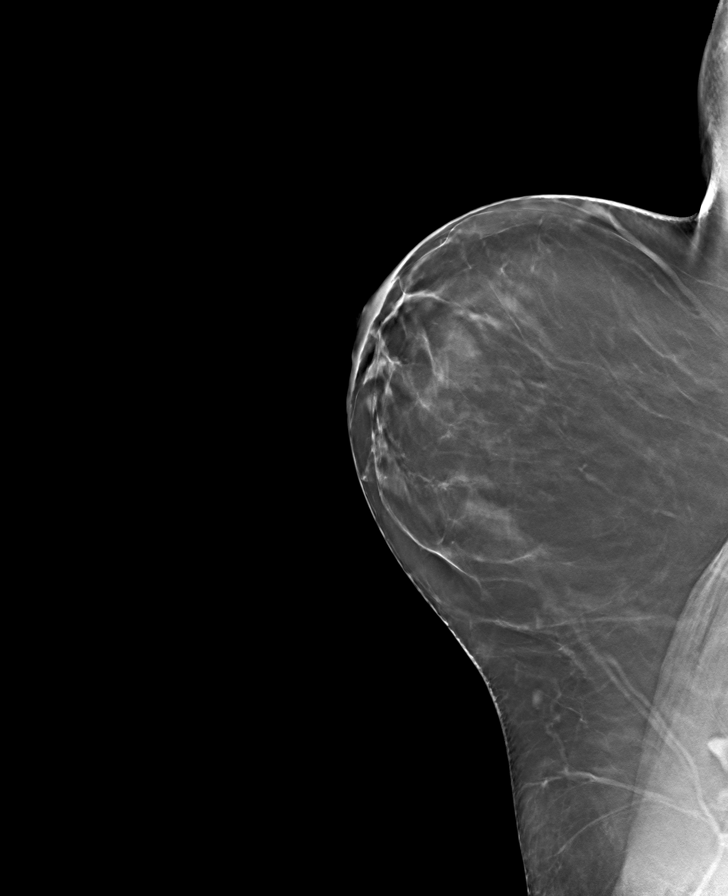

[8 of 24 positions shown; findings below may reference images not displayed]

ACR Breast Density Category b: There are scattered areas of
fibroglandular density.
FINDINGS: There are no findings suspicious for malignancy. Images were
processed with CAD.
IMPRESSION: No mammographic evidence of malignancy. A result letter of this
screening mammogram will be mailed directly to the patient.

RECOMMENDATION:
Screening mammogram in one year. (Code:CN-U-775)

BI-RADS CATEGORY  1: Negative.

## 2022-04-16 ENCOUNTER — Ambulatory Visit: Payer: Managed Care, Other (non HMO) | Attending: Orthopedic Surgery | Admitting: Physical Therapy

## 2022-04-16 ENCOUNTER — Encounter: Payer: Self-pay | Admitting: Physical Therapy

## 2022-04-16 DIAGNOSIS — M62830 Muscle spasm of back: Secondary | ICD-10-CM | POA: Insufficient documentation

## 2022-04-16 NOTE — Therapy (Signed)
[place] [facility] [address] [place], [address] Phone: [phone]   Fax:  [phone]  Physical Therapy Evaluation  Patient Details  Name: Sarah Mahoney MRN: [id number] Date of Birth: [birth year] No data recorded  Encounter Date: 04/16/2022    Past Medical History:  Diagnosis Date   Allergy    Anxiety    Asthma    Chicken pox    Depression    Menorrhagia with irregular cycle 02/06/2018   Screening for colon cancer 12/2018   NEG Cologuard; repeat after 3 yrs    Past Surgical History:  Procedure Laterality Date   ADENOIDECTOMY  1987   TONSILLECTOMY  1987   WISDOM TOOTH EXTRACTION      There were no vitals filed for this visit.       OBJECTIVE  Mental Status Patient is oriented to person, place and time.  Recent memory is intact.  Remote memory is intact.  Attention span and concentration are intact.  Expressive speech is intact.  Patient's fund of knowledge is within normal limits for educational level.  SENSATION: Grossly intact to light touch bilateral UE as determined by testing dermatomes C2-T2 Proprioception and hot/cold testing deferred on this date   MUSCULOSKELETAL: Tremor: None Bulk: Normal Tone: Normal  Posture  slight increased toracic kyphosis, FHRS  Observation: overhead raise with fairly normalized scapulohumeral rhythm, slight increase in scapular protraction  Palpation  TTP at midtrap/romboid group reporting concordant pain sign. Tender at latissimus dorsi, secondary pain site, with patient reporting pain feels deeper than where PT is able to palpate  Strength R/L 5/5 Shoulder flexion (anterior deltoid/pec major/coracobrachialis, axillary n. (C5/6) and musculocutaneous n. (C5-7)) 5/5 Shoulder abduction (deltoid/supraspinatus, axillary/suprascapular n, C5) 5/5 Shoulder external rotation (infraspinatus/teres minor) 5/5 Shoulder internal rotation  (subcapularis/lats/pec major) 5/5 Shoulder extension (posterior deltoid, lats, teres major, axillary/thoracodorsal n.) 5/5 Scapular retraction 5/5 Scapular protraction discomfort with resisted R protraction 5/5 Latissimus  UT shrug 5/5 bilat 4/5 Y lower trap 4+/5 T Scapular retractors Cervical isometrics are strong in all directions;  AROM R/L All cervical and shoulder AROM WNL witout pain  All thoracic and scapular motions WNL with same "pull" concordant pain sign to R periscapular area with R scapular protraction *Indicates pain, overpressure performed unless otherwise indicated  PROM R/L PROM = AROM  *Indicates pain, overpressure performed unless otherwise indicated    Muscle Length Upper Trap: wnl bilat Levator: wnl bilat   Passive Accessory Intervertebral Motion (PAIVM) Pt denies reproduction of neck pain with CPA C2-T7 and UPA bilaterally C2-T7. Generally hypomobile throughout    SPECIAL TESTS Spurlings A (ipsilateral lateral flexion/axial compression): R: Positive L: Positive Spurlings B (ipsilateral lateral flexion/contralateral rotation/axial compression): R: Positive L: Positive Distraction Test: Negative  ULTT Median: R: Positive L: Positive ULTT Ulnar: R: Positive L: Positive ULTT Radial: R: Positive L: Positive    Ther-Ex PT reviewed the following HEP with patient with patient able to demonstrate a set of the following with min cuing for correction needed. PT educated patient on parameters of therex (how/when to inc/decrease intensity, frequency, rep/set range, stretch hold time, and purpose of therex) with verbalized understanding.  Serratus stretch x30sec hold Scapular retractor stretch 30sec hold Lat release on wall 30sec hold Heat 2x/day 10-32mins                         Objective measurements completed on examination: See above findings.  Patient will benefit from skilled  therapeutic intervention in order to improve the following deficits and impairments:     Visit Diagnosis: No diagnosis found.     Problem List Patient Active Problem List   Diagnosis Date Noted   Depression    Asthma    Anxiety    Annual physical exam 08/20/2016   Hilda Lias DPT Hilda Lias, PT 04/16/2022, 2:38 PM  [facility] PH[facility] SPORTS MEDICINE [address], [place], [address] Phone: [phone]   Fax:  [phone]  Name: Sarah Mahoney MRN: [id number] Date of Birth: 1971-08-20

## 2022-04-18 ENCOUNTER — Encounter: Payer: Managed Care, Other (non HMO) | Admitting: Physical Therapy

## 2022-04-22 ENCOUNTER — Ambulatory Visit: Payer: Managed Care, Other (non HMO) | Attending: Orthopedic Surgery

## 2022-04-22 DIAGNOSIS — M62830 Muscle spasm of back: Secondary | ICD-10-CM | POA: Insufficient documentation

## 2022-04-22 NOTE — Therapy (Signed)
[facility] [address] [place], [address] Phone: [phone]   Fax:  [phone]  Physical Therapy Treatment  Patient Details  Name: Sarah Mahoney MRN: [id number] Date of Birth: 1971/05/25 No data recorded  Encounter Date: 04/22/2022   PT End of Session - 04/22/22 1215     Visit Number 2    Number of Visits 17    Date for PT Re-Evaluation 06/20/22    Authorization Type [employer] Managed    Authorization Time Period 04/19/22-06/20/22    Progress Note Due on Visit 10    PT Start Time 1133    PT Stop Time 1215    PT Time Calculation (min) 42 min    Activity Tolerance Patient tolerated treatment well;No increased pain    Behavior During Therapy [facility] for tasks assessed/performed             Past Medical History:  Diagnosis Date   Allergy    Anxiety    Asthma    Chicken pox    Depression    Menorrhagia with irregular cycle 02/06/2018   Screening for colon cancer 12/2018   NEG Cologuard; repeat after 3 yrs    Past Surgical History:  Procedure Laterality Date   ADENOIDECTOMY  1987   TONSILLECTOMY  1987   WISDOM TOOTH EXTRACTION      There were no vitals filed for this visit.   Subjective Assessment - 04/22/22 1136     Subjective Pt has been working on her HEP stretches with success. They all are easy to perform and target her speciic area of symptoms. Pt reports she noticed some pain increase after prednisone finished up 3 days ago, she ended up resuming intermittent meloxicam for pain relief, particularly at night.    Pertinent History Pt is a 51 year old female presenting with R scapular pain. Reports May 3rd she was lifting a weight overhead and felt an immediate pull, no pop sensation. That day pain was not so bad that she could not continued her workout, but has worsened over time. Describes pain as sharp (feels like this is from the cortisone shot), but more so an achy, dull pain that goes  across her shoulder blade to posterior armpit,  worst: 7/10 best 0/10 current 2/10. Does have some tingling 1x/day toward elbow. Pain is worsened at night, with sleeping on either side, leaning back on her couch, pushups, sit ups. Pain is allieviated by her Theme park manager with heat pad and stretching. Pt is R handed. 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 She enjoys kickboxing 3x/week, has not been able to do this d/t being tired from lack of sleep with pain, and push ups and sit ups in the class are difficult. Pt denies N/V, B&B changes, unexplained weight fluctuation, saddle paresthesia, fever, night sweats, or unrelenting night pain at this time.    Currently in Pain? Yes    Pain Score 2     Pain Location --   Lateral portion scapular Right below spine           INTERVENTION:  -UBE 60sec, fwd, 60-sec backward, repeat level 3 seat 7  -hooklying isometric row 10*3secH  -MFR to infraspinatus, then TPDN trial  -ART to infraspinatus  *no pain with reisted shoulder adduction, elbow extension, ER, IR -hooklying BUE GHJ ER c redTB 1x15x3secH  -seated RUE GHJ ER c 3lb  weight 1x12 -seated scpaular retraction 1x12, cues to avoid full out row activation  -seated RUE GHJ ER c 3lb weight 1x12    PT Short Term Goals - 04/19/22 0942       PT SHORT TERM GOAL #1   Title Pt will be independent with HEP in order to improve strength and decrease scapular pain in order to improve pain-free function at home and work.    Baseline 04/19/22 HEP given    Time 4    Period Weeks    Status New               PT Long Term Goals - 04/19/22 0943       PT LONG TERM GOAL #1   Title Pt will decrease worst scapular pain as reported on NPRS by at least 2 points in order to demonstrate clinically significant reduction in back pain.    Baseline 04/19/22 7/10 worst pain    Time 8    Period Weeks    Status New       PT LONG TERM GOAL #2   Title Pt will demonstrate gross periscapular strength of 5/5 in order to demonstrate PLOF strength needed for heavy household task and normal exercise regimen    Baseline 04/19/22 lower trap Y position 4/5; scap retraction T position 4+/5    Time 8    Period Weeks    Status New      PT LONG TERM GOAL #3   Title Patient will increase FOTO score to 77 to demonstrate predicted increase in functional mobility to complete ADLs    Baseline 04/19/22 50    Time 8    Period Weeks    Status New                   Plan - 04/22/22 1219     Clinical Impression Statement Reviewed HEP assignment. Began UBE for AA/ROM. Trial of TPDN today, but no resistance felt at needle level, no specific taut bands identified, no twitch response. PT does have a typical response to trigger point release prior to needling- good resolution with sustained pressure. Added in targeted loading of Rt shoulder external rotators. Pt toelrates session well, no increased pain, no overt fatigue.    Personal Factors and Comorbidities Comorbidity 1;Time since onset of injury/illness/exacerbation    Examination-Activity Limitations Reach Overhead;Lift;Carry    Examination-Participation Restrictions Community Activity;Yard Work;Meal Prep    Stability/Clinical Decision Making Evolving/Moderate complexity    Clinical Decision Making Moderate    Rehab Potential Good    PT Frequency 2x / week    PT Duration 8 weeks    PT Treatment/Interventions ADLs/Self Care Home Management;Iontophoresis 4mg /ml Dexamethasone;Therapeutic exercise;Passive range of motion;Electrical Stimulation;Moist Heat;Traction;Cryotherapy;Ultrasound;Therapeutic activities;Neuromuscular re-education;Functional mobility training;Patient/family education;Manual techniques;Dry needling    PT Next Visit Plan FU on response to needling/ER loading.    PT Home Exercise Plan lat, serratus, and rhomboid stretch; heat 2x/day    Consulted and Agree  with Plan of Care Patient             Patient will benefit from skilled therapeutic intervention in order to improve the following deficits and impairments:  Decreased mobility, Decreased endurance, Decreased range of motion, Decreased strength, Increased fascial restricitons, Impaired UE functional use, Pain, Improper body mechanics, Postural dysfunction, Impaired tone, Impaired flexibility, Increased muscle spasms  Visit Diagnosis: Muscle spasm of back     Problem List Patient Active Problem List   Diagnosis Date Noted   Depression  Asthma    Anxiety    Annual physical exam 08/20/2016   12:26 PM, 04/22/22 Rosamaria Lints, PT, DPT Physical Therapist - [place] [phone] (Office)   [place] C, PT 04/22/2022, 12:24 PM  [place] [facility] PH[facility] AND SPORTS MEDICINE [address], [place], [address] Phone: [phone]   Fax:  [phone]  Name: Whitley Patchen MRN: [id number] Date of Birth: 1971/01/21

## 2022-04-24 NOTE — Addendum Note (Signed)
Addended by: Lawrence Marseilles on: 04/24/2022 08:52 PM   Modules accepted: Orders

## 2022-04-25 ENCOUNTER — Encounter: Payer: Self-pay | Admitting: Physical Therapy

## 2022-04-25 ENCOUNTER — Ambulatory Visit: Payer: Managed Care, Other (non HMO) | Admitting: Physical Therapy

## 2022-04-25 DIAGNOSIS — M62830 Muscle spasm of back: Secondary | ICD-10-CM

## 2022-04-25 NOTE — Therapy (Signed)
[place] [facility] [address] [place], [address] Phone: [phone]   Fax:  [phone]  Physical Therapy Treatment  Patient Details  Name: Sarah Mahoney MRN: [id number] Date of Birth: November 07, 1970 No data recorded  Encounter Date: 04/25/2022   PT End of Session - 04/25/22 0841     Visit Number 3    Number of Visits 17    Date for PT Re-Evaluation 06/20/22    Authorization Type [employer] Managed    Authorization Time Period 04/19/22-06/20/22    Authorization - Visit Number 3    Progress Note Due on Visit 10    PT Start Time 0835    PT Stop Time 0913    PT Time Calculation (min) 38 min    Activity Tolerance Patient tolerated treatment well    Behavior During Therapy [facility] for tasks assessed/performed             Past Medical History:  Diagnosis Date   Allergy    Anxiety    Asthma    Chicken pox    Depression    Menorrhagia with irregular cycle 02/06/2018   Screening for colon cancer 12/2018   NEG Cologuard; repeat after 3 yrs    Past Surgical History:  Procedure Laterality Date   ADENOIDECTOMY  1987   TONSILLECTOMY  1987   WISDOM TOOTH EXTRACTION      There were no vitals filed for this visit.   Subjective Assessment - 04/25/22 0839     Subjective Reports she thinks TPDN last session has really helped her pain. Reports less radiating pain into the arm and reports stiffness this am, but no "pain". PT sleeping position advisement greatly helped her pain. Is having some compensatory LUE fatigue.    Pertinent History Pt is a 51 year old female presenting with R scapular pain. Reports May 3rd she was lifting a weight overhead and felt an immediate pull, no pop sensation. That day pain was not so bad that she could not continued her workout, but has worsened over time. Describes pain as sharp (feels like this is from the cortisone shot), but more so an achy, dull pain that goes across her shoulder blade to  posterior armpit,  worst: 7/10 best 0/10 current 2/10. Does have some tingling 1x/day toward elbow. Pain is worsened at night, with sleeping on either side, leaning back on her couch, pushups, sit ups. Pain is allieviated by her Theme park manager with heat pad and stretching. Pt is R handed. 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 She enjoys kickboxing 3x/week, has not been able to do this d/t being tired from lack of sleep with pain, and push ups and sit ups in the class are difficult. Pt denies N/V, B&B changes, unexplained weight fluctuation, saddle paresthesia, fever, night sweats, or unrelenting night pain at this time.    Limitations Lifting;Sitting;House hold activities    How long can you sit comfortably? in ergonomic chair unlimited; on couch 20-36mins    How long can you stand comfortably? unlimited    How long can you walk comfortably? unlimited    Diagnostic tests Xrays negative    Patient Stated Goals sleep and not have pain    Pain Onset More than a month ago             INTERVENTION:  -UBE 60sec, fwd, 60-sec backward, repeat level 3 seat  7  Push up plus 2x 12 with no pain (pt reports more concordant activation with scapular retraction than push away Supine scap retraction <> protraction BUE at 90d x12; 2# DB x8 hooklying BUE GHJ ER c GTB x12; RTB x15 with cuing for eccentric control with good carry over Post shoulder rolls with retraction + depression focus x15 Doorframe midtrap/rhomboid stretch    Manual -MFR to infraspinatus, then TPDN trial  -ART to infraspinatus                            PT Education - 04/25/22 0840     Education Details TPDN, therex technique    Person(s) Educated Patient    Methods Explanation;Demonstration;Verbal cues    Comprehension Verbalized understanding;Returned demonstration;Verbal cues required              PT  Short Term Goals - 04/19/22 0942       PT SHORT TERM GOAL #1   Title Pt will be independent with HEP in order to improve strength and decrease scapular pain in order to improve pain-free function at home and work.    Baseline 04/19/22 HEP given    Time 4    Period Weeks    Status New               PT Long Term Goals - 04/19/22 0943       PT LONG TERM GOAL #1   Title Pt will decrease worst scapular pain as reported on NPRS by at least 2 points in order to demonstrate clinically significant reduction in back pain.    Baseline 04/19/22 7/10 worst pain    Time 8    Period Weeks    Status New      PT LONG TERM GOAL #2   Title Pt will demonstrate gross periscapular strength of 5/5 in order to demonstrate PLOF strength needed for heavy household task and normal exercise regimen    Baseline 04/19/22 lower trap Y position 4/5; scap retraction T position 4+/5    Time 8    Period Weeks    Status New      PT LONG TERM GOAL #3   Title Patient will increase FOTO score to 77 to demonstrate predicted increase in functional mobility to complete ADLs    Baseline 04/19/22 50    Time 8    Period Weeks    Status New                   Plan - 04/25/22 1251     Clinical Impression Statement PT continued POC to redice pain at midtrap/rhomboid region with success. Paitent demonstrates strong, painless protraction. Patient is able to comply with all cuing for proper technique of therex with excellent motivation throughout session, without increase in pain. pt will continue progression as able.    Personal Factors and Comorbidities Comorbidity 1;Time since onset of injury/illness/exacerbation    Comorbidities A/D    Examination-Activity Limitations Reach Overhead;Lift;Carry    Examination-Participation Restrictions Community Activity;Yard Work;Meal Prep    Stability/Clinical Decision Making Evolving/Moderate complexity    Clinical Decision Making Moderate    Rehab Potential Good    PT  Frequency 2x / week    PT Duration 8 weeks    PT Treatment/Interventions ADLs/Self Care Home Management;Iontophoresis 4mg /ml Dexamethasone;Therapeutic exercise;Passive range of motion;Electrical Stimulation;Moist Heat;Traction;Cryotherapy;Ultrasound;Therapeutic activities;Neuromuscular re-education;Functional mobility training;Patient/family education;Manual techniques;Dry needling    PT Next Visit Plan TDN  PT Home Exercise Plan lat, serratus, and rhomboid stretch; heat 2x/day    Consulted and Agree with Plan of Care Patient             Patient will benefit from skilled therapeutic intervention in order to improve the following deficits and impairments:  Decreased mobility, Decreased endurance, Decreased range of motion, Decreased strength, Increased fascial restricitons, Impaired UE functional use, Pain, Improper body mechanics, Postural dysfunction, Impaired tone, Impaired flexibility, Increased muscle spasms  Visit Diagnosis: Muscle spasm of back     Problem List Patient Active Problem List   Diagnosis Date Noted   Depression    Asthma    Anxiety    Annual physical exam 08/20/2016    Hilda Lias, PT 04/25/2022, 1:49 PM  [place] [facility] [address] [place], [address] Phone: [phone]   Fax:  [phone]  Name: Sarah Mahoney MRN: [id number] Date of Birth: 08/21/1971

## 2022-04-25 NOTE — Addendum Note (Signed)
Addended by: Lawrence Marseilles on: 04/25/2022 08:31 AM   Modules accepted: Orders

## 2022-04-29 ENCOUNTER — Ambulatory Visit: Payer: Managed Care, Other (non HMO) | Admitting: Physical Therapy

## 2022-04-29 ENCOUNTER — Encounter: Payer: Self-pay | Admitting: Physical Therapy

## 2022-04-29 DIAGNOSIS — M62830 Muscle spasm of back: Secondary | ICD-10-CM | POA: Diagnosis not present

## 2022-04-29 NOTE — Therapy (Signed)
[place] [facility] [address] [place], [address] Phone: [phone]   Fax:  [phone]  Physical Therapy Treatment  Patient Details  Name: Sarah Mahoney MRN: [id number] Date of Birth: Dec 29, 1970 No data recorded  Encounter Date: 04/29/2022   PT End of Session - 04/29/22 0837     Visit Number 4    Number of Visits 17    Date for PT Re-Evaluation 06/20/22    Authorization Type [employer] Managed    Authorization Time Period 04/19/22-06/20/22    Authorization - Visit Number 4    Progress Note Due on Visit 10    PT Start Time 0834    PT Stop Time 0912    PT Time Calculation (min) 38 min    Activity Tolerance Patient tolerated treatment well    Behavior During Therapy [facility] for tasks assessed/performed             Past Medical History:  Diagnosis Date   Allergy    Anxiety    Asthma    Chicken pox    Depression    Menorrhagia with irregular cycle 02/06/2018   Screening for colon cancer 12/2018   NEG Cologuard; repeat after 3 yrs    Past Surgical History:  Procedure Laterality Date   ADENOIDECTOMY  1987   TONSILLECTOMY  1987   WISDOM TOOTH EXTRACTION      There were no vitals filed for this visit.   Subjective Assessment - 04/29/22 0838     Subjective Doing well overall. Reports very minimal pain since last session. Reports some discomfort with sleep still, but no pain with ADLs. Compliance with HEP.    Pertinent History Pt is a 51 year old female presenting with R scapular pain. Reports May 3rd she was lifting a weight overhead and felt an immediate pull, no pop sensation. That day pain was not so bad that she could not continued her workout, but has worsened over time. Describes pain as sharp (feels like this is from the cortisone shot), but more so an achy, dull pain that goes across her shoulder blade to posterior armpit,  worst: 7/10 best 0/10 current 2/10. Does have some tingling 1x/day toward elbow.  Pain is worsened at night, with sleeping on either side, leaning back on her couch, pushups, sit ups. Pain is allieviated by her Theme park manager with heat pad and stretching. Pt is R handed. 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 She enjoys kickboxing 3x/week, has not been able to do this d/t being tired from lack of sleep with pain, and push ups and sit ups in the class are difficult. Pt denies N/V, B&B changes, unexplained weight fluctuation, saddle paresthesia, fever, night sweats, or unrelenting night pain at this time.    Limitations Lifting;Sitting;House hold activities    How long can you sit comfortably? in ergonomic chair unlimited; on couch 20-27mins    How long can you stand comfortably? unlimited    How long can you walk comfortably? unlimited    Diagnostic tests Xrays negative    Patient Stated Goals sleep and not have pain    Pain Onset More than a month ago               INTERVENTION:  -UBE 60sec, fwd, 60-sec backward, repeat level 3 seat 7  Push up plus from knees 2x 10 with good carry over following  demo and min cuing for set up Alt scap taps from elevated mat table 2x 12 with min cuing for scapular position with good carry over Cross body punch RTB with eccentric return 2x 12 with excellent carry over of initial demo Post shoulder rolls with retraction + depression focus x15 Doorframe midtrap/rhomboid stretch  Lat stretch 30sec seated   Manual -MFR to infraspinatus and latissimus dorsi near proximal insertion -ART to infraspinatus                          PT Education - 04/29/22 0837     Education Details TPDN, therex technique    Person(s) Educated Patient    Methods Explanation;Demonstration;Verbal cues    Comprehension Verbalized understanding;Returned demonstration;Verbal cues required              PT Short Term Goals - 04/19/22 0942        PT SHORT TERM GOAL #1   Title Pt will be independent with HEP in order to improve strength and decrease scapular pain in order to improve pain-free function at home and work.    Baseline 04/19/22 HEP given    Time 4    Period Weeks    Status New               PT Long Term Goals - 04/19/22 0943       PT LONG TERM GOAL #1   Title Pt will decrease worst scapular pain as reported on NPRS by at least 2 points in order to demonstrate clinically significant reduction in back pain.    Baseline 04/19/22 7/10 worst pain    Time 8    Period Weeks    Status New      PT LONG TERM GOAL #2   Title Pt will demonstrate gross periscapular strength of 5/5 in order to demonstrate PLOF strength needed for heavy household task and normal exercise regimen    Baseline 04/19/22 lower trap Y position 4/5; scap retraction T position 4+/5    Time 8    Period Weeks    Status New      PT LONG TERM GOAL #3   Title Patient will increase FOTO score to 77 to demonstrate predicted increase in functional mobility to complete ADLs    Baseline 04/19/22 50    Time 8    Period Weeks    Status New                   Plan - 04/29/22 0910     Clinical Impression Statement PT continue dtherex progression for increased scapular stabilization strength with stabilizing, and eccentric focus with success. PT  forewent TPDN to gauge continued progress/success without passive modality. Patient is motivated throughout session and is able to comply with all cuing for proper technique of therex with no increased pain. PT will continue progression as able.    Personal Factors and Comorbidities Comorbidity 1;Time since onset of injury/illness/exacerbation    Comorbidities A/D    Examination-Activity Limitations Reach Overhead;Lift;Carry    Examination-Participation Restrictions Community Activity;Yard Work;Meal Prep    Stability/Clinical Decision Making Evolving/Moderate complexity    Clinical Decision Making  Moderate    Rehab Potential Good    PT Frequency 2x / week    PT Duration 8 weeks    PT Treatment/Interventions ADLs/Self Care Home Management;Iontophoresis 4mg /ml Dexamethasone;Therapeutic exercise;Passive range of motion;Electrical Stimulation;Moist Heat;Traction;Cryotherapy;Ultrasound;Therapeutic activities;Neuromuscular re-education;Functional mobility training;Patient/family education;Manual techniques;Dry needling    PT Next  Visit Plan TDN    PT Home Exercise Plan lat, serratus, and rhomboid stretch; heat 2x/day    Consulted and Agree with Plan of Care Patient             Patient will benefit from skilled therapeutic intervention in order to improve the following deficits and impairments:  Decreased mobility, Decreased endurance, Decreased range of motion, Decreased strength, Increased fascial restricitons, Impaired UE functional use, Pain, Improper body mechanics, Postural dysfunction, Impaired tone, Impaired flexibility, Increased muscle spasms  Visit Diagnosis: Muscle spasm of back     Problem List Patient Active Problem List   Diagnosis Date Noted   Depression    Asthma    Anxiety    Annual physical exam 08/20/2016   Hilda Lias DPT Hilda Lias, PT 04/29/2022, 9:22 AM  [employer] [facility] [address] [place], [address] Phone: [phone]   Fax:  [phone]  Name: Annaston Upham MRN: [id number] Date of Birth: Sep 12, 1971

## 2022-04-30 ENCOUNTER — Encounter: Payer: Managed Care, Other (non HMO) | Admitting: Physical Therapy

## 2022-05-02 ENCOUNTER — Ambulatory Visit: Payer: Managed Care, Other (non HMO) | Admitting: Physical Therapy

## 2022-05-02 ENCOUNTER — Encounter: Payer: Self-pay | Admitting: Physical Therapy

## 2022-05-02 DIAGNOSIS — M62830 Muscle spasm of back: Secondary | ICD-10-CM | POA: Diagnosis not present

## 2022-05-02 NOTE — Therapy (Signed)
[place] [facility] [address] [place], [address] Phone: [phone]   Fax:  [phone]  Physical Therapy Treatment  Patient Details  Name: Vivienne Sangiovanni MRN: [id number] Date of Birth: 10-24-1970 No data recorded  Encounter Date: 05/02/2022   PT End of Session - 05/02/22 0859     Visit Number 5    Number of Visits 17    Date for PT Re-Evaluation 06/20/22    Authorization Type [employer] Managed    Authorization Time Period 04/19/22-06/20/22    Authorization - Visit Number 5    Progress Note Due on Visit 10    PT Start Time 0838    PT Stop Time 0916    PT Time Calculation (min) 38 min    Activity Tolerance Patient tolerated treatment well    Behavior During Therapy [facility] for tasks assessed/performed             Past Medical History:  Diagnosis Date   Allergy    Anxiety    Asthma    Chicken pox    Depression    Menorrhagia with irregular cycle 02/06/2018   Screening for colon cancer 12/2018   NEG Cologuard; repeat after 3 yrs    Past Surgical History:  Procedure Laterality Date   ADENOIDECTOMY  1987   TONSILLECTOMY  1987   WISDOM TOOTH EXTRACTION      There were no vitals filed for this visit.   Subjective Assessment - 05/02/22 0841     Subjective Pt reports very minimal pain this morning, reports this is more of a soreness 3/10. Did sleep well last night. Reports compliance with HEP.    Pertinent History Pt is a 51 year old female presenting with R scapular pain. Reports May 3rd she was lifting a weight overhead and felt an immediate pull, no pop sensation. That day pain was not so bad that she could not continued her workout, but has worsened over time. Describes pain as sharp (feels like this is from the cortisone shot), but more so an achy, dull pain that goes across her shoulder blade to posterior armpit,  worst: 7/10 best 0/10 current 2/10. Does have some tingling 1x/day toward elbow. Pain is  worsened at night, with sleeping on either side, leaning back on her couch, pushups, sit ups. Pain is allieviated by her Theme park manager with heat pad and stretching. Pt is R handed. 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 She enjoys kickboxing 3x/week, has not been able to do this d/t being tired from lack of sleep with pain, and push ups and sit ups in the class are difficult. Pt denies N/V, B&B changes, unexplained weight fluctuation, saddle paresthesia, fever, night sweats, or unrelenting night pain at this time.    Limitations Lifting;Sitting;House hold activities    How long can you sit comfortably? in ergonomic chair unlimited; on couch 20-24mins    How long can you stand comfortably? unlimited    How long can you walk comfortably? unlimited    Diagnostic tests Xrays negative    Patient Stated Goals sleep and not have pain    Pain Onset More than a month ago              INTERVENTION:  -UBE 60sec, fwd, 60-sec backward, repeat level 3 seat 7  Push up plus from knees x10; with hands on bosu hardside Alt scap  taps from knees with hands on bosu (hardside) 2x 10 with min cuing for scapular position with good carry over Cross body upward diagonal punch GTB with eccentric return 2x 12 with excellent carry over of initial demo Forward punch 5# (OMEGA) 2x 10  Post shoulder rolls with retraction + depression focus x15 Doorframe midtrap/rhomboid stretch  Lat stretch 30sec seated   Manual -MFR to infraspinatus and rhomboid group near proximal insertion -ART to infraspinatus                           PT Education - 05/02/22 0858     Education Details therex form/technique    Person(s) Educated Patient    Methods Explanation;Demonstration;Verbal cues    Comprehension Verbal cues required;Returned demonstration;Verbalized understanding              PT Short Term  Goals - 04/19/22 0942       PT SHORT TERM GOAL #1   Title Pt will be independent with HEP in order to improve strength and decrease scapular pain in order to improve pain-free function at home and work.    Baseline 04/19/22 HEP given    Time 4    Period Weeks    Status New               PT Long Term Goals - 04/19/22 0943       PT LONG TERM GOAL #1   Title Pt will decrease worst scapular pain as reported on NPRS by at least 2 points in order to demonstrate clinically significant reduction in back pain.    Baseline 04/19/22 7/10 worst pain    Time 8    Period Weeks    Status New      PT LONG TERM GOAL #2   Title Pt will demonstrate gross periscapular strength of 5/5 in order to demonstrate PLOF strength needed for heavy household task and normal exercise regimen    Baseline 04/19/22 lower trap Y position 4/5; scap retraction T position 4+/5    Time 8    Period Weeks    Status New      PT LONG TERM GOAL #3   Title Patient will increase FOTO score to 77 to demonstrate predicted increase in functional mobility to complete ADLs    Baseline 04/19/22 50    Time 8    Period Weeks    Status New                   Plan - 05/02/22 0909     Clinical Impression Statement PT continued therex progression for increased scapular stabilization and strength (with rotational focus) with success. Pt is able to comply with all cuing for proper technique of therex with excellent motivation throughout session. With manual techniques, patient demonstrates some maintained tension at infraspinatus and rhomboid group, but continuing to decrease between sessions. PT will continue progression as able.    Personal Factors and Comorbidities Comorbidity 1;Time since onset of injury/illness/exacerbation    Comorbidities A/D    Examination-Activity Limitations Reach Overhead;Lift;Carry    Examination-Participation Restrictions Community Activity;Yard Work;Meal Prep    Stability/Clinical Decision  Making Evolving/Moderate complexity    Clinical Decision Making Moderate    Rehab Potential Good    PT Frequency 2x / week    PT Duration 8 weeks    PT Treatment/Interventions ADLs/Self Care Home Management;Iontophoresis 4mg /ml Dexamethasone;Therapeutic exercise;Passive range of motion;Electrical Stimulation;Moist Heat;Traction;Cryotherapy;Ultrasound;Therapeutic activities;Neuromuscular re-education;Functional mobility training;Patient/family education;Manual techniques;Dry  needling    PT Next Visit Plan TDN    PT Home Exercise Plan lat, serratus, and rhomboid stretch; heat 2x/day    Consulted and Agree with Plan of Care Patient             Patient will benefit from skilled therapeutic intervention in order to improve the following deficits and impairments:  Decreased mobility, Decreased endurance, Decreased range of motion, Decreased strength, Increased fascial restricitons, Impaired UE functional use, Pain, Improper body mechanics, Postural dysfunction, Impaired tone, Impaired flexibility, Increased muscle spasms  Visit Diagnosis: Muscle spasm of back     Problem List Patient Active Problem List   Diagnosis Date Noted   Depression    Asthma    Anxiety    Annual physical exam 08/20/2016   Hilda Lias DPT Hilda Lias, PT 05/02/2022, 9:32 AM  [place] [facility] [address] [place], [address] Phone: [phone]   Fax:  [phone]  Name: Yazmen Briones MRN: [id number] Date of Birth: [birth year]

## 2022-05-06 ENCOUNTER — Encounter: Payer: Self-pay | Admitting: Physical Therapy

## 2022-05-06 ENCOUNTER — Ambulatory Visit: Payer: Managed Care, Other (non HMO) | Admitting: Physical Therapy

## 2022-05-06 DIAGNOSIS — M62830 Muscle spasm of back: Secondary | ICD-10-CM | POA: Diagnosis not present

## 2022-05-06 NOTE — Therapy (Signed)
[facility] [address] [place], [address] Phone: [phone]   Fax:  [phone]  Physical Therapy Treatment  Patient Details  Name: Sarah Mahoney MRN: [id number] Date of Birth: 11/15/1970 No data recorded  Encounter Date: 05/06/2022   PT End of Session - 05/06/22 1144     Visit Number 6    Number of Visits 17    Date for PT Re-Evaluation 06/20/22    Authorization Type [employer] Managed    Authorization Time Period 04/19/22-06/20/22    Authorization - Visit Number 6    Progress Note Due on Visit 10    PT Start Time 1137    PT Stop Time 1215    PT Time Calculation (min) 38 min    Activity Tolerance Patient tolerated treatment well    Behavior During Therapy [facility] for tasks assessed/performed             Past Medical History:  Diagnosis Date   Allergy    Anxiety    Asthma    Chicken pox    Depression    Menorrhagia with irregular cycle 02/06/2018   Screening for colon cancer 12/2018   NEG Cologuard; repeat after 3 yrs    Past Surgical History:  Procedure Laterality Date   ADENOIDECTOMY  1987   TONSILLECTOMY  1987   WISDOM TOOTH EXTRACTION      There were no vitals filed for this visit.   Subjective Assessment - 05/06/22 1143     Subjective doing well overall. REports compliance with HEP and no pain today.    Pertinent History Pt is a 51 year old female presenting with R scapular pain. Reports May 3rd she was lifting a weight overhead and felt an immediate pull, no pop sensation. That day pain was not so bad that she could not continued her workout, but has worsened over time. Describes pain as sharp (feels like this is from the cortisone shot), but more so an achy, dull pain that goes across her shoulder blade to posterior armpit,  worst: 7/10 best 0/10 current 2/10. Does have some tingling 1x/day toward elbow. Pain is worsened at night, with sleeping on either side, leaning back on her couch,  pushups, sit ups. Pain is allieviated by her Theme park manager with heat pad and stretching. Pt is R handed. 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 She enjoys kickboxing 3x/week, has not been able to do this d/t being tired from lack of sleep with pain, and push ups and sit ups in the class are difficult. Pt denies N/V, B&B changes, unexplained weight fluctuation, saddle paresthesia, fever, night sweats, or unrelenting night pain at this time.    Limitations Lifting;Sitting;House hold activities    How long can you sit comfortably? in ergonomic chair unlimited; on couch 20-54mins    How long can you stand comfortably? unlimited    How long can you walk comfortably? unlimited    Diagnostic tests Xrays negative    Patient Stated Goals sleep and not have pain    Pain Onset More than a month ago                INTERVENTION:  -UBE 60sec, fwd, 60-sec backward, repeat level 3 seat 7  15# DB shrug  2x 12 with good carry over demo Farmers carry 20# KB suspended by double looped grey TB 2x 170ft with  min cuing for scapular activation with  10# KB twists overhead 2x 12 (6 each way) Post shoulder rolls with retraction + depression focus x15 Doorframe midtrap/rhomboid stretch  Lat stretch 30sec seated   Manual -ART and MFR to supraspinatus and latissimus  Latissimus stretch 30sec                        PT Education - 05/06/22 1143     Education Details therex form/technique    Person(s) Educated Patient    Methods Explanation;Demonstration;Verbal cues    Comprehension Returned demonstration;Verbalized understanding              PT Short Term Goals - 04/19/22 0942       PT SHORT TERM GOAL #1   Title Pt will be independent with HEP in order to improve strength and decrease scapular pain in order to improve pain-free function at home and work.    Baseline 04/19/22 HEP  given    Time 4    Period Weeks    Status New               PT Long Term Goals - 04/19/22 0943       PT LONG TERM GOAL #1   Title Pt will decrease worst scapular pain as reported on NPRS by at least 2 points in order to demonstrate clinically significant reduction in back pain.    Baseline 04/19/22 7/10 worst pain    Time 8    Period Weeks    Status New      PT LONG TERM GOAL #2   Title Pt will demonstrate gross periscapular strength of 5/5 in order to demonstrate PLOF strength needed for heavy household task and normal exercise regimen    Baseline 04/19/22 lower trap Y position 4/5; scap retraction T position 4+/5    Time 8    Period Weeks    Status New      PT LONG TERM GOAL #3   Title Patient will increase FOTO score to 77 to demonstrate predicted increase in functional mobility to complete ADLs    Baseline 04/19/22 50    Time 8    Period Weeks    Status New                   Plan - 05/06/22 1253     Clinical Impression Statement PT continued therex progression for scapular stabilization with patient able to comply with all cuing for proper technique of therex. Pt is motivated throughout session, and reports minimal pain with max stabilizing therex of overhead stabilization. PT will continue rpogression as able.    Personal Factors and Comorbidities Comorbidity 1;Time since onset of injury/illness/exacerbation    Comorbidities A/D    Examination-Activity Limitations Reach Overhead;Lift;Carry    Examination-Participation Restrictions Community Activity;Yard Work;Meal Prep    Stability/Clinical Decision Making Evolving/Moderate complexity    Clinical Decision Making Moderate    Rehab Potential Good    PT Frequency 2x / week    PT Duration 8 weeks    PT Treatment/Interventions ADLs/Self Care Home Management;Iontophoresis 4mg /ml Dexamethasone;Therapeutic exercise;Passive range of motion;Electrical Stimulation;Moist  Heat;Traction;Cryotherapy;Ultrasound;Therapeutic activities;Neuromuscular re-education;Functional mobility training;Patient/family education;Manual techniques;Dry needling    PT Next Visit Plan TDN    PT Home Exercise Plan lat, serratus, and rhomboid stretch; heat 2x/day    Consulted and Agree with Plan of Care Patient             Patient will benefit from skilled  therapeutic intervention in order to improve the following deficits and impairments:  Decreased mobility, Decreased endurance, Decreased range of motion, Decreased strength, Increased fascial restricitons, Impaired UE functional use, Pain, Improper body mechanics, Postural dysfunction, Impaired tone, Impaired flexibility, Increased muscle spasms  Visit Diagnosis: Muscle spasm of back     Problem List Patient Active Problem List   Diagnosis Date Noted   Depression    Asthma    Anxiety    Annual physical exam 08/20/2016   Hilda Lias DPT Hilda Lias, PT 05/06/2022, 1:06 PM  [place] [address] [place], [address] Phone: [phone]   Fax:  [phone]  Name: Sarah Mahoney MRN: [id number] Date of Birth: [birth year]

## 2022-05-09 ENCOUNTER — Encounter: Payer: Self-pay | Admitting: Physical Therapy

## 2022-05-09 ENCOUNTER — Ambulatory Visit: Payer: Managed Care, Other (non HMO) | Admitting: Physical Therapy

## 2022-05-09 DIAGNOSIS — M62830 Muscle spasm of back: Secondary | ICD-10-CM

## 2022-05-09 NOTE — Therapy (Signed)
OUTPATIENT PHYSICAL THERAPY TREATMENT NOTE/ DC Summary Reporting Period 04/19/22 - 05/09/22   Patient Name: Sarah Mahoney MRN: [id number] DOB:12/10/1970, 51 y.o., female Today's Date: 05/09/2022  PCP: Maurine Minister MD REFERRING PROVIDER: Amador Cunas PA-C   PT End of Session - 05/09/22 1054     Visit Number 7    Number of Visits 17    Date for PT Re-Evaluation 06/20/22    Authorization Type [employer] Managed    Authorization Time Period 04/19/22-06/20/22    Authorization - Visit Number 7    Progress Note Due on Visit 10    PT Start Time 1049    PT Stop Time 1125    PT Time Calculation (min) 36 min    Activity Tolerance Patient tolerated treatment well    Behavior During Therapy [facility] for tasks assessed/performed             Past Medical History:  Diagnosis Date   Allergy    Anxiety    Asthma    Chicken pox    Depression    Menorrhagia with irregular cycle 02/06/2018   Screening for colon cancer 12/2018   NEG Cologuard; repeat after 3 yrs   Past Surgical History:  Procedure Laterality Date   ADENOIDECTOMY  1987   TONSILLECTOMY  1987   WISDOM TOOTH EXTRACTION     Patient Active Problem List   Diagnosis Date Noted   Depression    Asthma    Anxiety    Annual physical exam 08/20/2016    REFERRING DIAG: R shoulder pain  THERAPY DIAG:  Muscle spasm of back  Rationale for Evaluation and Treatment Rehabilitation  PERTINENT HISTORY: pt is a 51 year old female presenting with R scapular pain. Reports May 3rd she was lifting a weight overhead and felt an immediate pull, no pop sensation. That day pain was not so bad that she could not continued her workout, but has worsened over time. Describes pain as sharp (feels like this is from the cortisone shot), but more so an achy, dull pain that goes across her shoulder blade to posterior armpit,  worst: 7/10 best 0/10 current 2/10. Does have some tingling 1x/day toward elbow. Pain is worsened at night, with sleeping on  either side, leaning back on her couch, pushups, sit ups. Pain is allieviated by her Theme park manager with heat pad and stretching. Pt is R handed. 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 She enjoys kickboxing 3x/week, has not been able to do this d/t being tired from lack of sleep with pain, and push ups and sit ups in the class are difficult. Pt denies N/V, B&B changes, unexplained weight fluctuation, saddle paresthesia, fever, night sweats, or unrelenting night pain at this time.  PRECAUTIONS: none  SUBJECTIVE: Pt was able to complete cardio kickboxing class this week without pain. She did have difficulty with sit ups at the end of class d/t laying down on her shoulder blade. Current she is having the majority of pain at infraspinatus when she lays down on her back- particularly on the floor, getting better laying on bed. No pain today. Reports compliance with HEP   PAIN:  Are you having pain? No     TODAY'S TREATMENT:  INTERVENTION:  -UBE 60sec, fwd, 60-sec backward, repeat level 3 seat 7   PT reviewed the following HEP with patient with patient able to demonstrate a set of the following with min cuing  for correction needed. PT educated patient on parameters of therex (how/when to inc/decrease intensity, frequency, rep/set range, stretch hold time, and purpose of therex) with verbalized understanding.  Access Code: [email or web address] URL: [email or web address] Date: 05/09/2022 Prepared by: Hilda Lias  Exercises - Single Arm Bent Over Shoulder Horizontal Abduction with Dumbbell - Thumb Up  - 1 x daily - 1-2 x weekly - 3 sets - 6-12 reps - Standing Bent Over Shoulder Row  - 1 x daily - 1-2 x weekly - 3 sets - 6-12 reps - Standing Shoulder External Rotation with Resistance  - 1 x daily - 1-2 x weekly - 3 sets - 6-12 reps - Single Arm Bent Over Shoulder Scaption with Dumbbell  - 1 x daily  - 7 x weekly - 3 sets - 6-12 reps - Seated Thoracic Lumbar Extension with Pectoralis Stretch  - 1-5 x daily - 7 x weekly - 12-20 reps   Manual -ART and MFR to supraspinatus and latissimus  Latissimus stretch 30sec   PATIENT EDUCATION: Education details: d.c recommendations and HEP Person educated: Patient Education method: Programmer, multimedia, Demonstration, Verbal cues, and Handouts Education comprehension: verbalized understanding, returned demonstration, and verbal cues required   HOME EXERCISE PROGRAM:  [email or web address]   PT Short Term Goals - 04/19/22 0942       PT SHORT TERM GOAL #1   Title Pt will be independent with HEP in order to improve strength and decrease scapular pain in order to improve pain-free function at home and work.    Baseline 04/19/22 HEP given    Time 4    Period Weeks    Status New              PT Long Term Goals - 05/09/22 1100       PT LONG TERM GOAL #1   Title Pt will decrease worst scapular pain as reported on NPRS by at least 2 points in order to demonstrate clinically significant reduction in back pain.    Baseline 04/19/22 7/10 worst pain; 05/09/22 0/10    Time 8    Period Weeks    Status Achieved      PT LONG TERM GOAL #2   Title Pt will demonstrate gross periscapular strength of 5/5 in order to demonstrate PLOF strength needed for heavy household task and normal exercise regimen    Baseline 04/19/22 lower trap Y position 4/5; scap retraction T position 4+/5; 05/09/22 5/5 bilat Y and T    Time 8    Period Weeks    Status Achieved      PT LONG TERM GOAL #3   Title Patient will increase FOTO score to 77 to demonstrate predicted increase in functional mobility to complete ADLs    Baseline 04/19/22 50; 05/09/22 92    Time 8    Period Weeks    Status Achieved                 Hilda Lias DPT Hilda Lias, PT 05/09/2022, 11:28 AM

## 2022-05-10 ENCOUNTER — Encounter: Payer: Managed Care, Other (non HMO) | Admitting: Physical Therapy

## 2022-05-13 ENCOUNTER — Encounter: Payer: Managed Care, Other (non HMO) | Admitting: Physical Therapy

## 2022-05-16 ENCOUNTER — Encounter: Payer: Managed Care, Other (non HMO) | Admitting: Physical Therapy

## 2022-05-17 ENCOUNTER — Encounter: Payer: Managed Care, Other (non HMO) | Admitting: Physical Therapy

## 2022-05-20 ENCOUNTER — Encounter: Payer: Managed Care, Other (non HMO) | Admitting: Physical Therapy

## 2022-06-06 ENCOUNTER — Other Ambulatory Visit: Payer: Self-pay | Admitting: Physician Assistant

## 2022-06-06 DIAGNOSIS — Z1231 Encounter for screening mammogram for malignant neoplasm of breast: Secondary | ICD-10-CM

## 2022-06-27 ENCOUNTER — Ambulatory Visit
Admission: RE | Admit: 2022-06-27 | Discharge: 2022-06-27 | Disposition: A | Discharge status: Home / Self Care | Payer: Managed Care, Other (non HMO) | Source: Ambulatory Visit | Attending: Physician Assistant | Admitting: Physician Assistant

## 2022-06-27 DIAGNOSIS — Z1231 Encounter for screening mammogram for malignant neoplasm of breast: Secondary | ICD-10-CM | POA: Insufficient documentation

## 2023-07-02 ENCOUNTER — Other Ambulatory Visit: Payer: Self-pay | Admitting: Physician Assistant

## 2023-07-02 DIAGNOSIS — Z1231 Encounter for screening mammogram for malignant neoplasm of breast: Secondary | ICD-10-CM

## 2023-09-03 ENCOUNTER — Ambulatory Visit
Admission: RE | Admit: 2023-09-03 | Discharge: 2023-09-03 | Disposition: A | Discharge status: Home / Self Care | Payer: Managed Care, Other (non HMO) | Source: Ambulatory Visit | Attending: Physician Assistant | Admitting: Physician Assistant

## 2023-09-03 DIAGNOSIS — Z1231 Encounter for screening mammogram for malignant neoplasm of breast: Secondary | ICD-10-CM | POA: Diagnosis present

## 2024-07-07 ENCOUNTER — Other Ambulatory Visit: Payer: Self-pay | Admitting: Physician Assistant

## 2024-07-07 DIAGNOSIS — Z1231 Encounter for screening mammogram for malignant neoplasm of breast: Secondary | ICD-10-CM
